# Patient Record
Sex: Female | Born: 1965 | Race: White | Hispanic: No | Marital: Married | Smoking: Never smoker
Health system: Southern US, Community
[De-identification: ages and names within clinical notes are randomized; demographics above are authoritative.]

## PROBLEM LIST (undated history)

## (undated) DIAGNOSIS — O151 Eclampsia in labor: Secondary | ICD-10-CM

## (undated) DIAGNOSIS — I10 Essential (primary) hypertension: Secondary | ICD-10-CM

## (undated) DIAGNOSIS — L309 Dermatitis, unspecified: Secondary | ICD-10-CM

## (undated) DIAGNOSIS — N979 Female infertility, unspecified: Secondary | ICD-10-CM

## (undated) HISTORY — DX: Dermatitis, unspecified: L30.9

## (undated) HISTORY — PX: TYMPANOSTOMY TUBE PLACEMENT: SHX32

## (undated) HISTORY — PX: TONSILLECTOMY: SUR1361

---

## 2009-12-25 ENCOUNTER — Ambulatory Visit: Payer: Self-pay | Admitting: Diagnostic Radiology

## 2009-12-25 ENCOUNTER — Emergency Department (HOSPITAL_BASED_OUTPATIENT_CLINIC_OR_DEPARTMENT_OTHER): Admission: EM | Admit: 2009-12-25 | Discharge: 2009-12-25 | Payer: Self-pay | Admitting: Emergency Medicine

## 2010-10-13 ENCOUNTER — Emergency Department (HOSPITAL_BASED_OUTPATIENT_CLINIC_OR_DEPARTMENT_OTHER): Admission: EM | Admit: 2010-10-13 | Discharge: 2010-10-13 | Payer: Self-pay | Admitting: Emergency Medicine

## 2011-03-06 LAB — URINALYSIS, ROUTINE W REFLEX MICROSCOPIC
Bilirubin Urine: NEGATIVE
Glucose, UA: NEGATIVE mg/dL
Ketones, ur: NEGATIVE mg/dL
Specific Gravity, Urine: 1.01 (ref 1.005–1.030)
pH: 7.5 (ref 5.0–8.0)

## 2011-03-06 LAB — PREGNANCY, URINE: Preg Test, Ur: NEGATIVE

## 2011-06-21 ENCOUNTER — Other Ambulatory Visit: Payer: Self-pay | Admitting: Family Medicine

## 2011-06-21 DIAGNOSIS — Z1231 Encounter for screening mammogram for malignant neoplasm of breast: Secondary | ICD-10-CM

## 2011-07-05 ENCOUNTER — Ambulatory Visit
Admission: RE | Admit: 2011-07-05 | Discharge: 2011-07-05 | Disposition: A | Discharge status: Home / Self Care | Payer: 59 | Source: Ambulatory Visit | Attending: Family Medicine | Admitting: Family Medicine

## 2011-07-05 DIAGNOSIS — Z1231 Encounter for screening mammogram for malignant neoplasm of breast: Secondary | ICD-10-CM

## 2012-08-06 LAB — OB RESULTS CONSOLE ANTIBODY SCREEN: Antibody Screen: NEGATIVE

## 2012-08-06 LAB — OB RESULTS CONSOLE ABO/RH: RH Type: POSITIVE

## 2012-08-06 LAB — OB RESULTS CONSOLE HIV ANTIBODY (ROUTINE TESTING): HIV: NONREACTIVE

## 2012-08-06 LAB — OB RESULTS CONSOLE RPR: RPR: NONREACTIVE

## 2013-01-29 ENCOUNTER — Encounter (HOSPITAL_COMMUNITY): Payer: Self-pay

## 2013-01-29 ENCOUNTER — Inpatient Hospital Stay (HOSPITAL_COMMUNITY): Payer: 59 | Admitting: Anesthesiology

## 2013-01-29 ENCOUNTER — Other Ambulatory Visit: Payer: Self-pay | Admitting: Obstetrics and Gynecology

## 2013-01-29 ENCOUNTER — Encounter (HOSPITAL_COMMUNITY): Payer: Self-pay | Admitting: Anesthesiology

## 2013-01-29 ENCOUNTER — Encounter (HOSPITAL_COMMUNITY)
Admission: AD | Disposition: A | Discharge status: Home / Self Care | Payer: Self-pay | Source: Ambulatory Visit | Attending: Internal Medicine

## 2013-01-29 ENCOUNTER — Other Ambulatory Visit: Payer: Self-pay

## 2013-01-29 ENCOUNTER — Inpatient Hospital Stay (HOSPITAL_COMMUNITY)
Admission: AD | Admit: 2013-01-29 | Discharge: 2013-02-04 | DRG: 765 | Disposition: A | Discharge status: Home / Self Care | Payer: 59 | Source: Ambulatory Visit | Attending: Internal Medicine | Admitting: Internal Medicine

## 2013-01-29 ENCOUNTER — Encounter (HOSPITAL_COMMUNITY): Payer: Self-pay | Admitting: *Deleted

## 2013-01-29 ENCOUNTER — Inpatient Hospital Stay (HOSPITAL_COMMUNITY): Payer: 59

## 2013-01-29 DIAGNOSIS — I6783 Posterior reversible encephalopathy syndrome: Secondary | ICD-10-CM | POA: Diagnosis not present

## 2013-01-29 DIAGNOSIS — O152 Eclampsia in the puerperium: Secondary | ICD-10-CM | POA: Diagnosis not present

## 2013-01-29 DIAGNOSIS — Z79899 Other long term (current) drug therapy: Secondary | ICD-10-CM

## 2013-01-29 DIAGNOSIS — O30009 Twin pregnancy, unspecified number of placenta and unspecified number of amniotic sacs, unspecified trimester: Secondary | ICD-10-CM | POA: Diagnosis present

## 2013-01-29 DIAGNOSIS — I10 Essential (primary) hypertension: Secondary | ICD-10-CM

## 2013-01-29 DIAGNOSIS — G9349 Other encephalopathy: Secondary | ICD-10-CM | POA: Diagnosis not present

## 2013-01-29 DIAGNOSIS — R680 Hypothermia, not associated with low environmental temperature: Secondary | ICD-10-CM | POA: Diagnosis not present

## 2013-01-29 DIAGNOSIS — IMO0002 Reserved for concepts with insufficient information to code with codable children: Secondary | ICD-10-CM | POA: Diagnosis present

## 2013-01-29 DIAGNOSIS — I313 Pericardial effusion (noninflammatory): Secondary | ICD-10-CM | POA: Diagnosis not present

## 2013-01-29 DIAGNOSIS — J310 Chronic rhinitis: Secondary | ICD-10-CM | POA: Diagnosis not present

## 2013-01-29 DIAGNOSIS — R569 Unspecified convulsions: Secondary | ICD-10-CM

## 2013-01-29 DIAGNOSIS — O3090 Multiple gestation, unspecified, unspecified trimester: Secondary | ICD-10-CM | POA: Diagnosis present

## 2013-01-29 DIAGNOSIS — O26899 Other specified pregnancy related conditions, unspecified trimester: Secondary | ICD-10-CM | POA: Diagnosis not present

## 2013-01-29 DIAGNOSIS — J95821 Acute postprocedural respiratory failure: Secondary | ICD-10-CM | POA: Diagnosis not present

## 2013-01-29 DIAGNOSIS — O151 Eclampsia in labor: Secondary | ICD-10-CM

## 2013-01-29 DIAGNOSIS — I469 Cardiac arrest, cause unspecified: Secondary | ICD-10-CM | POA: Diagnosis not present

## 2013-01-29 DIAGNOSIS — O365990 Maternal care for other known or suspected poor fetal growth, unspecified trimester, not applicable or unspecified: Principal | ICD-10-CM | POA: Diagnosis present

## 2013-01-29 DIAGNOSIS — O329XX0 Maternal care for malpresentation of fetus, unspecified, not applicable or unspecified: Secondary | ICD-10-CM | POA: Diagnosis present

## 2013-01-29 DIAGNOSIS — I3139 Other pericardial effusion (noninflammatory): Secondary | ICD-10-CM | POA: Diagnosis not present

## 2013-01-29 DIAGNOSIS — E869 Volume depletion, unspecified: Secondary | ICD-10-CM | POA: Diagnosis not present

## 2013-01-29 DIAGNOSIS — G4700 Insomnia, unspecified: Secondary | ICD-10-CM | POA: Diagnosis not present

## 2013-01-29 DIAGNOSIS — O30049 Twin pregnancy, dichorionic/diamniotic, unspecified trimester: Secondary | ICD-10-CM

## 2013-01-29 DIAGNOSIS — D649 Anemia, unspecified: Secondary | ICD-10-CM

## 2013-01-29 HISTORY — DX: Female infertility, unspecified: N97.9

## 2013-01-29 HISTORY — DX: Essential (primary) hypertension: I10

## 2013-01-29 HISTORY — DX: Eclampsia complicating labor: O15.1

## 2013-01-29 LAB — URINALYSIS, ROUTINE W REFLEX MICROSCOPIC
Leukocytes, UA: NEGATIVE
Nitrite: NEGATIVE
Protein, ur: 100 mg/dL — AB
Urobilinogen, UA: 0.2 mg/dL (ref 0.0–1.0)

## 2013-01-29 LAB — URINE MICROSCOPIC-ADD ON

## 2013-01-29 LAB — COMPREHENSIVE METABOLIC PANEL
ALT: 23 U/L (ref 0–35)
ALT: 78 U/L — ABNORMAL HIGH (ref 0–35)
AST: 35 U/L (ref 0–37)
AST: 125 U/L — ABNORMAL HIGH (ref 0–37)
Albumin: 2.5 g/dL — ABNORMAL LOW (ref 3.5–5.2)
Alkaline Phosphatase: 223 U/L — ABNORMAL HIGH (ref 39–117)
CO2: 18 mEq/L — ABNORMAL LOW (ref 19–32)
Calcium: 9.7 mg/dL (ref 8.4–10.5)
Calcium: 8.8 mg/dL (ref 8.4–10.5)
GFR calc Af Amer: 77 mL/min — ABNORMAL LOW (ref >90)
GFR calc non Af Amer: 62 mL/min — ABNORMAL LOW (ref >90)
Potassium: 4.4 mEq/L (ref 3.5–5.1)
Sodium: 138 mEq/L (ref 135–145)
Sodium: 138 mEq/L (ref 135–145)
Total Protein: 6.1 g/dL (ref 6.0–8.3)
Total Protein: 5.3 g/dL — ABNORMAL LOW (ref 6.0–8.3)

## 2013-01-29 LAB — BLOOD GAS, ARTERIAL
Acid-base deficit: 5.8 mmol/L — ABNORMAL HIGH (ref 0.0–2.0)
Drawn by: 29925
O2 Saturation: 97 %
TCO2: 19.7 mmol/L (ref 0–100)

## 2013-01-29 LAB — TROPONIN I: Troponin I: <0.3 ng/mL (ref <0.30)

## 2013-01-29 LAB — CBC
HCT: 36.7 % (ref 36.0–46.0)
MCH: 26.6 pg (ref 26.0–34.0)
MCV: 81 fL (ref 78.0–100.0)
Platelets: 216 10*3/uL (ref 150–400)
Platelets: 201 10*3/uL (ref 150–400)
RBC: 4.53 MIL/uL (ref 3.87–5.11)
RBC: 4.44 MIL/uL (ref 3.87–5.11)
WBC: 9 10*3/uL (ref 4.0–10.5)

## 2013-01-29 LAB — URIC ACID: Uric Acid, Serum: 6.2 mg/dL (ref 2.4–7.0)

## 2013-01-29 LAB — GLUCOSE, CAPILLARY: Glucose-Capillary: 70 mg/dL (ref 70–99)

## 2013-01-29 SURGERY — Surgical Case
Anesthesia: Spinal | Site: Abdomen | Wound class: Clean Contaminated

## 2013-01-29 MED ORDER — BUPIVACAINE HCL (PF) 0.25 % IJ SOLN
INTRAMUSCULAR | Status: DC | PRN
  Administered 2013-01-29: 10 mL

## 2013-01-29 MED ORDER — PRENATAL MULTIVITAMIN CH: 1.0000 | ORAL_TABLET | Freq: Every day | ORAL | Status: DC

## 2013-01-29 MED ORDER — LABETALOL HCL 5 MG/ML IV SOLN
INTRAVENOUS | Status: AC
  Filled 2013-01-29: qty 8

## 2013-01-29 MED ORDER — LABETALOL HCL 200 MG PO TABS: 200.0000 mg | ORAL_TABLET | Freq: Once | ORAL | Status: DC

## 2013-01-29 MED ORDER — LABETALOL HCL 5 MG/ML IV SOLN
20.0000 mg | Freq: Once | INTRAVENOUS | Status: AC
  Administered 2013-01-29: 20 mg via INTRAVENOUS

## 2013-01-29 MED ORDER — NALOXONE HCL 1 MG/ML IJ SOLN: 1.0000 ug/kg/h | INTRAVENOUS | Status: DC | PRN

## 2013-01-29 MED ORDER — NALBUPHINE HCL 10 MG/ML IJ SOLN: 5.0000 mg | INTRAMUSCULAR | Status: DC | PRN

## 2013-01-29 MED ORDER — SODIUM CHLORIDE 0.9 % IJ SOLN: 3.0000 mL | INTRAMUSCULAR | Status: DC | PRN

## 2013-01-29 MED ORDER — BUPIVACAINE IN DEXTROSE 0.75-8.25 % IT SOLN
INTRATHECAL | Status: DC | PRN
  Administered 2013-01-29: 1.5 mL via INTRATHECAL

## 2013-01-29 MED ORDER — KETOROLAC TROMETHAMINE 30 MG/ML IJ SOLN: 30.0000 mg | Freq: Four times a day (QID) | INTRAMUSCULAR | Status: DC | PRN

## 2013-01-29 MED ORDER — CITRIC ACID-SODIUM CITRATE 334-500 MG/5ML PO SOLN: 30.0000 mL | Freq: Once | ORAL | Status: DC

## 2013-01-29 MED ORDER — DIPHENHYDRAMINE HCL 50 MG/ML IJ SOLN: 25.0000 mg | INTRAMUSCULAR | Status: DC | PRN

## 2013-01-29 MED ORDER — LORAZEPAM 2 MG/ML IJ SOLN
INTRAMUSCULAR | Status: AC
  Filled 2013-01-29: qty 1

## 2013-01-29 MED ORDER — KETOROLAC TROMETHAMINE 60 MG/2ML IM SOLN
INTRAMUSCULAR | Status: AC
  Administered 2013-01-29: 60 mg via INTRAMUSCULAR
  Filled 2013-01-29: qty 2

## 2013-01-29 MED ORDER — LACTATED RINGERS IV SOLN
INTRAVENOUS | Status: DC
  Administered 2013-01-29 – 2013-01-31 (×5): via INTRAVENOUS

## 2013-01-29 MED ORDER — OXYTOCIN 10 UNIT/ML IJ SOLN
40.0000 [IU] | INTRAVENOUS | Status: DC | PRN
  Administered 2013-01-29: 40 [IU] via INTRAVENOUS

## 2013-01-29 MED ORDER — METOCLOPRAMIDE HCL 5 MG/ML IJ SOLN
10.0000 mg | Freq: Three times a day (TID) | INTRAMUSCULAR | Status: DC | PRN
  Filled 2013-01-29: qty 2

## 2013-01-29 MED ORDER — LABETALOL HCL 5 MG/ML IV SOLN
40.0000 mg | Freq: Once | INTRAVENOUS | Status: AC
  Administered 2013-01-29: 40 mg via INTRAVENOUS

## 2013-01-29 MED ORDER — PHENYLEPHRINE HCL 10 MG/ML IJ SOLN
INTRAMUSCULAR | Status: DC | PRN
  Administered 2013-01-29 (×4): 40 ug via INTRAVENOUS

## 2013-01-29 MED ORDER — DIBUCAINE 1 % RE OINT: 1.0000 "application " | TOPICAL_OINTMENT | RECTAL | Status: DC | PRN

## 2013-01-29 MED ORDER — SIMETHICONE 80 MG PO CHEW
80.0000 mg | CHEWABLE_TABLET | ORAL | Status: DC | PRN
  Filled 2013-01-29: qty 1

## 2013-01-29 MED ORDER — IBUPROFEN 600 MG PO TABS: 600.0000 mg | ORAL_TABLET | Freq: Four times a day (QID) | ORAL | Status: DC | PRN

## 2013-01-29 MED ORDER — HYDRALAZINE HCL 20 MG/ML IJ SOLN
INTRAMUSCULAR | Status: AC
  Filled 2013-01-29: qty 1

## 2013-01-29 MED ORDER — FAMOTIDINE IN NACL 20-0.9 MG/50ML-% IV SOLN: 20.0000 mg | Freq: Once | INTRAVENOUS | Status: DC

## 2013-01-29 MED ORDER — KETOROLAC TROMETHAMINE 60 MG/2ML IM SOLN
60.0000 mg | Freq: Once | INTRAMUSCULAR | Status: AC | PRN
  Administered 2013-01-29: 60 mg via INTRAMUSCULAR

## 2013-01-29 MED ORDER — ONDANSETRON HCL 4 MG/2ML IJ SOLN: 4.0000 mg | Freq: Three times a day (TID) | INTRAMUSCULAR | Status: DC | PRN

## 2013-01-29 MED ORDER — EPHEDRINE SULFATE 50 MG/ML IJ SOLN
INTRAMUSCULAR | Status: DC | PRN
  Administered 2013-01-29: 10 mg via INTRAVENOUS

## 2013-01-29 MED ORDER — IBUPROFEN 600 MG PO TABS
600.0000 mg | ORAL_TABLET | Freq: Four times a day (QID) | ORAL | Status: DC
  Administered 2013-01-30: 600 mg via ORAL
  Filled 2013-01-29 (×4): qty 1

## 2013-01-29 MED ORDER — MORPHINE SULFATE (PF) 0.5 MG/ML IJ SOLN
INTRAMUSCULAR | Status: DC | PRN
  Administered 2013-01-29: .1 mg via INTRATHECAL

## 2013-01-29 MED ORDER — CEFAZOLIN SODIUM-DEXTROSE 2-3 GM-% IV SOLR
2.0000 g | INTRAVENOUS | Status: AC
  Administered 2013-01-29: 2 g via INTRAVENOUS
  Filled 2013-01-29: qty 50

## 2013-01-29 MED ORDER — LACTATED RINGERS IV BOLUS (SEPSIS)
1000.0000 mL | Freq: Once | INTRAVENOUS | Status: AC
  Administered 2013-01-29: 1000 mL via INTRAVENOUS

## 2013-01-29 MED ORDER — TETANUS-DIPHTH-ACELL PERTUSSIS 5-2.5-18.5 LF-MCG/0.5 IM SUSP
0.5000 mL | Freq: Once | INTRAMUSCULAR | Status: AC
  Administered 2013-01-30: 0.5 mL via INTRAMUSCULAR
  Filled 2013-01-29: qty 0.5

## 2013-01-29 MED ORDER — LANOLIN HYDROUS EX OINT: 1.0000 "application " | TOPICAL_OINTMENT | CUTANEOUS | Status: DC | PRN

## 2013-01-29 MED ORDER — WITCH HAZEL-GLYCERIN EX PADS: 1.0000 "application " | MEDICATED_PAD | CUTANEOUS | Status: DC | PRN

## 2013-01-29 MED ORDER — MEPERIDINE HCL 25 MG/ML IJ SOLN
INTRAMUSCULAR | Status: AC
  Administered 2013-01-29: 6.25 mg via INTRAVENOUS
  Filled 2013-01-29: qty 1

## 2013-01-29 MED ORDER — MAGNESIUM SULFATE 40 G IN LACTATED RINGERS - SIMPLE
1.0000 g/h | INTRAVENOUS | Status: DC
  Administered 2013-01-29 – 2013-01-31 (×2): 2 g/h via INTRAVENOUS
  Filled 2013-01-29 (×2): qty 500

## 2013-01-29 MED ORDER — EPHEDRINE 5 MG/ML INJ
INTRAVENOUS | Status: AC
  Filled 2013-01-29: qty 10

## 2013-01-29 MED ORDER — HYDRALAZINE HCL 20 MG/ML IJ SOLN
5.0000 mg | Freq: Once | INTRAMUSCULAR | Status: AC
  Administered 2013-01-29: 5 mg via INTRAVENOUS

## 2013-01-29 MED ORDER — SCOPOLAMINE 1 MG/3DAYS TD PT72
1.0000 | MEDICATED_PATCH | Freq: Once | TRANSDERMAL | Status: DC
  Administered 2013-01-29: 1.5 mg via TRANSDERMAL

## 2013-01-29 MED ORDER — MEPERIDINE HCL 25 MG/ML IJ SOLN
6.2500 mg | INTRAMUSCULAR | Status: DC | PRN
  Administered 2013-01-29: 6.25 mg via INTRAVENOUS

## 2013-01-29 MED ORDER — ONDANSETRON HCL 4 MG/2ML IJ SOLN
INTRAMUSCULAR | Status: DC | PRN
  Administered 2013-01-29: 4 mg via INTRAVENOUS

## 2013-01-29 MED ORDER — ONDANSETRON HCL 4 MG PO TABS: 4.0000 mg | ORAL_TABLET | ORAL | Status: DC | PRN

## 2013-01-29 MED ORDER — SIMETHICONE 80 MG PO CHEW
80.0000 mg | CHEWABLE_TABLET | Freq: Three times a day (TID) | ORAL | Status: DC
  Filled 2013-01-29 (×3): qty 1

## 2013-01-29 MED ORDER — FENTANYL CITRATE 0.05 MG/ML IJ SOLN
INTRAMUSCULAR | Status: AC
  Filled 2013-01-29: qty 2

## 2013-01-29 MED ORDER — CITRIC ACID-SODIUM CITRATE 334-500 MG/5ML PO SOLN
30.0000 mL | Freq: Once | ORAL | Status: AC
  Administered 2013-01-29: 30 mL via ORAL
  Filled 2013-01-29: qty 15

## 2013-01-29 MED ORDER — OXYTOCIN 40 UNITS IN LACTATED RINGERS INFUSION - SIMPLE MED
62.5000 mL/h | INTRAVENOUS | Status: AC
  Filled 2013-01-29: qty 1000

## 2013-01-29 MED ORDER — SALINE SPRAY 0.65 % NA SOLN
1.0000 | NASAL | Status: DC | PRN
  Administered 2013-01-29 – 2013-01-31 (×2): 1 via NASAL
  Filled 2013-01-29 (×2): qty 44

## 2013-01-29 MED ORDER — FAMOTIDINE IN NACL 20-0.9 MG/50ML-% IV SOLN
20.0000 mg | Freq: Once | INTRAVENOUS | Status: AC
  Administered 2013-01-29: 20 mg via INTRAVENOUS
  Filled 2013-01-29: qty 50

## 2013-01-29 MED ORDER — MAGNESIUM SULFATE BOLUS VIA INFUSION
4.0000 g | Freq: Once | INTRAVENOUS | Status: DC
  Filled 2013-01-29: qty 500

## 2013-01-29 MED ORDER — LACTATED RINGERS IV SOLN
INTRAVENOUS | Status: DC | PRN
  Administered 2013-01-29 (×3): via INTRAVENOUS

## 2013-01-29 MED ORDER — PHENYLEPHRINE 40 MCG/ML (10ML) SYRINGE FOR IV PUSH (FOR BLOOD PRESSURE SUPPORT)
PREFILLED_SYRINGE | INTRAVENOUS | Status: AC
  Filled 2013-01-29: qty 5

## 2013-01-29 MED ORDER — FENTANYL CITRATE 0.05 MG/ML IJ SOLN: 25.0000 ug | INTRAMUSCULAR | Status: DC | PRN

## 2013-01-29 MED ORDER — LORAZEPAM 2 MG/ML IJ SOLN
1.0000 mg | Freq: Once | INTRAMUSCULAR | Status: AC
  Administered 2013-01-29: 1 mg via INTRAVENOUS

## 2013-01-29 MED ORDER — ONDANSETRON HCL 4 MG/2ML IJ SOLN
4.0000 mg | INTRAMUSCULAR | Status: DC | PRN
  Administered 2013-01-31 – 2013-02-01 (×8): 4 mg via INTRAVENOUS
  Filled 2013-01-29 (×8): qty 2

## 2013-01-29 MED ORDER — SCOPOLAMINE 1 MG/3DAYS TD PT72
MEDICATED_PATCH | TRANSDERMAL | Status: AC
  Administered 2013-01-29: 1.5 mg via TRANSDERMAL
  Filled 2013-01-29: qty 1

## 2013-01-29 MED ORDER — DIPHENHYDRAMINE HCL 50 MG/ML IJ SOLN
12.5000 mg | INTRAMUSCULAR | Status: DC | PRN
  Administered 2013-01-30: 12.5 mg via INTRAVENOUS
  Filled 2013-01-29: qty 1

## 2013-01-29 MED ORDER — MORPHINE SULFATE 0.5 MG/ML IJ SOLN
INTRAMUSCULAR | Status: AC
  Filled 2013-01-29: qty 10

## 2013-01-29 MED ORDER — FENTANYL CITRATE 0.05 MG/ML IJ SOLN
INTRAMUSCULAR | Status: DC | PRN
  Administered 2013-01-29: 15 ug via INTRATHECAL

## 2013-01-29 MED ORDER — SENNOSIDES-DOCUSATE SODIUM 8.6-50 MG PO TABS
2.0000 | ORAL_TABLET | Freq: Every day | ORAL | Status: DC
  Filled 2013-01-29: qty 2

## 2013-01-29 MED ORDER — MENTHOL 3 MG MT LOZG: 1.0000 | LOZENGE | OROMUCOSAL | Status: DC | PRN

## 2013-01-29 MED ORDER — ZOLPIDEM TARTRATE 5 MG PO TABS: 5.0000 mg | ORAL_TABLET | Freq: Every evening | ORAL | Status: DC | PRN

## 2013-01-29 MED ORDER — LABETALOL HCL 200 MG PO TABS
200.0000 mg | ORAL_TABLET | Freq: Once | ORAL | Status: AC
  Administered 2013-01-29: 200 mg via ORAL
  Filled 2013-01-29: qty 1

## 2013-01-29 MED ORDER — DIPHENHYDRAMINE HCL 25 MG PO CAPS
25.0000 mg | ORAL_CAPSULE | Freq: Four times a day (QID) | ORAL | Status: DC | PRN
  Filled 2013-01-29: qty 1

## 2013-01-29 MED ORDER — DIPHENHYDRAMINE HCL 25 MG PO CAPS
25.0000 mg | ORAL_CAPSULE | ORAL | Status: DC | PRN
  Filled 2013-01-29: qty 1

## 2013-01-29 MED ORDER — OXYMETAZOLINE HCL 0.05 % NA SOLN
1.0000 | Freq: Two times a day (BID) | NASAL | Status: DC
  Administered 2013-01-31 – 2013-02-02 (×5): 1 via NASAL
  Filled 2013-01-29 (×3): qty 15

## 2013-01-29 MED ORDER — OXYCODONE-ACETAMINOPHEN 5-325 MG PO TABS: 1.0000 | ORAL_TABLET | ORAL | Status: DC | PRN

## 2013-01-29 MED ORDER — NALOXONE HCL 0.4 MG/ML IJ SOLN: 0.4000 mg | INTRAMUSCULAR | Status: DC | PRN

## 2013-01-29 SURGICAL SUPPLY — 31 items
CLOTH BEACON ORANGE TIMEOUT ST (SAFETY) ×2 IMPLANT
CONTAINER PREFILL 10% NBF 15ML (MISCELLANEOUS) IMPLANT
DRAPE LG THREE QUARTER DISP (DRAPES) ×2 IMPLANT
DRESSING TELFA 8X3 (GAUZE/BANDAGES/DRESSINGS) IMPLANT
DRSG OPSITE POSTOP 4X10 (GAUZE/BANDAGES/DRESSINGS) ×2 IMPLANT
DURAPREP 26ML APPLICATOR (WOUND CARE) ×2 IMPLANT
ELECT REM PT RETURN 9FT ADLT (ELECTROSURGICAL) ×2
ELECTRODE REM PT RTRN 9FT ADLT (ELECTROSURGICAL) ×1 IMPLANT
EXTRACTOR VACUUM M CUP 4 TUBE (SUCTIONS) IMPLANT
GAUZE SPONGE 4X4 12PLY STRL LF (GAUZE/BANDAGES/DRESSINGS) ×4 IMPLANT
GLOVE BIO SURGEON STRL SZ7.5 (GLOVE) ×4 IMPLANT
GOWN PREVENTION PLUS LG XLONG (DISPOSABLE) ×4 IMPLANT
GOWN PREVENTION PLUS XLARGE (GOWN DISPOSABLE) ×2 IMPLANT
KIT ABG SYR 3ML LUER SLIP (SYRINGE) IMPLANT
NEEDLE HYPO 25X1 1.5 SAFETY (NEEDLE) ×2 IMPLANT
NEEDLE HYPO 25X5/8 SAFETYGLIDE (NEEDLE) IMPLANT
NS IRRIG 1000ML POUR BTL (IV SOLUTION) ×2 IMPLANT
PACK C SECTION WH (CUSTOM PROCEDURE TRAY) ×2 IMPLANT
PAD ABD 7.5X8 STRL (GAUZE/BANDAGES/DRESSINGS) IMPLANT
SLEEVE SCD COMPRESS KNEE MED (MISCELLANEOUS) IMPLANT
STAPLER VISISTAT 35W (STAPLE) ×2 IMPLANT
SUT MNCRL 0 VIOLET CTX 36 (SUTURE) ×2 IMPLANT
SUT MON AB 2-0 CT1 27 (SUTURE) ×2 IMPLANT
SUT MON AB-0 CT1 36 (SUTURE) ×4 IMPLANT
SUT MONOCRYL 0 CTX 36 (SUTURE) ×2
SUT PLAIN 0 NONE (SUTURE) IMPLANT
SUT PLAIN 2 0 XLH (SUTURE) IMPLANT
SYR CONTROL 10ML LL (SYRINGE) ×2 IMPLANT
TOWEL OR 17X24 6PK STRL BLUE (TOWEL DISPOSABLE) ×6 IMPLANT
TRAY FOLEY CATH 14FR (SET/KITS/TRAYS/PACK) ×2 IMPLANT
WATER STERILE IRR 1000ML POUR (IV SOLUTION) ×2 IMPLANT

## 2013-01-29 NOTE — Anesthesia Postprocedure Evaluation (Signed)
  Anesthesia Post-op Note  Anesthesia Post Note  Patient: Hannah Vega  Procedure(s) Performed: Procedure(s) (LRB): CESAREAN SECTION  twins 35 weeks (N/A)  Anesthesia type: Spinal  Patient location: PACU  Post pain: Pain level controlled  Post assessment: Post-op Vital signs reviewed  Last Vitals:  Filed Vitals:   01/29/13 2030  BP: 119/89  Pulse: 63  Temp:   Resp: 20    Post vital signs: Reviewed  Level of consciousness: awake  Complications: No apparent anesthesia complications

## 2013-01-29 NOTE — Consult Note (Signed)
At 2157 I received a code blue notification for room 312. After a 2 minute delay for an elevator I arrived in the room with the MAU NP to find a post C-section patient having her airway attended to by two RT's with a mask airway. The patient was breathing ~ 10 times/min with minimal blood in her airway. Her oxygen saturation was 100%. The patient was clearly postictal however by the time I left her in the care of Dr. Seymour Bars she was responding to commands and with minimal prompting was aware she had delivered today. She was off by one on the date. Prior to Dr. Seymour Bars arriving I ordered that a 4gm magnesium bolus be started as she had already had seizure activity associated with high blood pressure. I was in the room from 2159-2224, except for a brief exit to notify the patient's family of her status and to offer comfort and reassurance.  Caren Macadam. M.D.

## 2013-01-29 NOTE — Transfer of Care (Signed)
Immediate Anesthesia Transfer of Care Note  Patient: Hannah Vega  Procedure(s) Performed: Procedure(s) with comments: CESAREAN SECTION  twins 35 weeks (N/A) - Primary C/S   EDD: 03/05/13  Patient Location: PACU  Anesthesia Type:Spinal  Level of Consciousness: awake, alert  and oriented  Airway & Oxygen Therapy: Patient Spontanous Breathing  Post-op Assessment: Report given to PACU RN and Post -op Vital signs reviewed and stable  Post vital signs: Reviewed and stable  Complications: No apparent anesthesia complications

## 2013-01-29 NOTE — Op Note (Signed)
Cesarean Section Procedure Note  Indications: HTN and severe IUGR twin A  Pre-operative Diagnosis: 35 week 0 day pregnancy.  Post-operative Diagnosis: same  Surgeon: Lenoard Aden   Assistants: mody  Anesthesia: Local anesthesia 0.25.% bupivacaine and Spinal anesthesia  ASA Class: 2  Procedure Details  The patient was seen in the Holding Room. The risks, benefits, complications, treatment options, and expected outcomes were discussed with the patient.  The patient concurred with the proposed plan, giving informed consent. The risks of anesthesia, infection, bleeding and possible injury to other organs discussed. Injury to bowel, bladder, or ureter with possible need for repair discussed. Possible need for transfusion with secondary risks of hepatitis or HIV acquisition discussed. Post operative complications to include but not limited to DVT, PE and Pneumonia noted. The site of surgery properly noted/marked. The patient was taken to Operating Room # 9, identified as Hannah Vega and the procedure verified as C-Section Delivery. A Time Out was held and the above information confirmed.  After induction of anesthesia, the patient was draped and prepped in the usual sterile manner. A Pfannenstiel incision was made and carried down through the subcutaneous tissue to the fascia. Fascial incision was made and extended transversely using Mayo scissors. The fascia was separated from the underlying rectus tissue superiorly and inferiorly. The peritoneum was identified and entered. Peritoneal incision was extended longitudinally. The utero-vesical peritoneal reflection was incised transversely and the bladder flap was bluntly freed from the lower uterine segment. A low transverse uterine incision(Kerr hysterotomy) was made. Delivered from transverse presentation was a  female with Apgar scores of 8 at one minute and 8 at five minutes and Breech delivery of twin B was a female with Apgars pending. Bulb  suctioning gently performed. Neonatal team in attendance.After the umbilical cord was clamped and cut cord blood was obtained for evaluation. The placenta was removed intact and appeared normal. The uterus was curetted with a dry lap pack. Good hemostasis was noted.The uterine outline, tubes and ovaries appeared normal. The uterine incision was closed with running locked sutures of 0 Monocryl x 2 layers. Hemostasis was observed. Lavage was carried out until clear.The parietal peritoneum was closed with a running 2-0 Monocryl suture. The fascia was then reapproximated with running sutures of 0 Monocryl. The skin was reapproximated with staples.  Instrument, sponge, and needle counts were correct prior the abdominal closure and at the conclusion of the case.   Findings: above  Estimated Blood Loss:  500         Drains: foley                 Specimens: placenta                 Complications:  None; patient tolerated the procedure well.         Disposition: PACU - hemodynamically stable.         Condition: stable  Attending Attestation: I performed the procedure.     Cesarean Section Procedure Note  Indications: twins  Pre-operative Diagnosis: 35 week 0 day pregnancy.  Post-operative Diagnosis: same  Surgeon: Lenoard Aden   Assistants: mody  Anesthesia: Local anesthesia 0.25.% bupivacaine and Spinal anesthesia  ASA Class: 2  Procedure Details  The patient was seen in the Holding Room. The risks, benefits, complications, treatment options, and expected outcomes were discussed with the patient.  The patient concurred with the proposed plan, giving informed consent. The risks of anesthesia, infection, bleeding and possible injury to other organs  discussed. Injury to bowel, bladder, or ureter with possible need for repair discussed. Possible need for transfusion with secondary risks of hepatitis or HIV acquisition discussed. Post operative complications to include but not  limited to DVT, PE and Pneumonia noted. The site of surgery properly noted/marked. The patient was taken to Operating Room # 9, identified as Hannah Vega and the procedure verified as C-Section Delivery. A Time Out was held and the above information confirmed.  After induction of anesthesia, the patient was draped and prepped in the usual sterile manner. A Pfannenstiel incision was made and carried down through the subcutaneous tissue to the fascia. Fascial incision was made and extended transversely using Mayo scissors. The fascia was separated from the underlying rectus tissue superiorly and inferiorly. The peritoneum was identified and entered. Peritoneal incision was extended longitudinally. The utero-vesical peritoneal reflection was incised transversely and the bladder flap was bluntly freed from the lower uterine segment. A low transverse uterine incision(Kerr hysterotomy) was made. Delivered from trans presentation was a  female with Apgar scores of 9 at one minute and 9 at five minutes. Bulb suctioning gently performed. Neonatal team in attendance.After the umbilical cord was clamped and cut cord blood was obtained for evaluation. The placenta was removed intact and appeared normal. The uterus was curetted with a dry lap pack. Good hemostasis was noted.The uterine outline, tubes and ovaries appeared normal. The uterine incision was closed with running locked sutures of 0 Monocryl x 2 layers. Hemostasis was observed. Lavage was carried out until clear.The parietal peritoneum was closed with a running 2-0 Monocryl suture. The fascia was then reapproximated with running sutures of 0 Monocryl. The skin was reapproximated with staples.  Instrument, sponge, and needle counts were correct prior the abdominal closure and at the conclusion of the case.   Findings: above  Estimated Blood Loss:  500         Drains: foley                 Specimens: above                 Complications:  None; patient  tolerated the procedure well.         Disposition: PACU - hemodynamically stable.         Condition: stable  Attending Attestation: I performed the procedure.     Cesarean Section Procedure Note  Indications: twins  Pre-operative Diagnosis: 35 week 0 day pregnancy.  Post-operative Diagnosis: same  Surgeon: Lenoard Aden   Assistants: mody   Anesthesia: Local anesthesia 0.25.% bupivacaine and Spinal anesthesia  ASA Class: 2  Procedure Details  The patient was seen in the Holding Room. The risks, benefits, complications, treatment options, and expected outcomes were discussed with the patient.  The patient concurred with the proposed plan, giving informed consent. The risks of anesthesia, infection, bleeding and possible injury to other organs discussed. Injury to bowel, bladder, or ureter with possible need for repair discussed. Possible need for transfusion with secondary risks of hepatitis or HIV acquisition discussed. Post operative complications to include but not limited to DVT, PE and Pneumonia noted. The site of surgery properly noted/marked. The patient was taken to Operating Room # a, identified as NAKEYSHA PASQUAL and the procedure verified as C-Section Delivery. A Time Out was held and the above information confirmed.  After induction of anesthesia, the patient was draped and prepped in the usual sterile manner. A Pfannenstiel incision was made and carried down through the subcutaneous  tissue to the fascia. Fascial incision was made and extended transversely using Mayo scissors. The fascia was separated from the underlying rectus tissue superiorly and inferiorly. The peritoneum was identified and entered. Peritoneal incision was extended longitudinally. The utero-vesical peritoneal reflection was incised transversely and the bladder flap was bluntly freed from the lower uterine segment. A low transverse uterine incision(Kerr hysterotomy) was made. Delivered from a presentation  was a  a with Apgar scores of 8 at one minute and 8 at five minutes. Bulb suctioning gently performed. Neonatal team in attendance.After the umbilical cord was clamped and cut cord blood was obtained for evaluation. The placenta was removed intact and appeared normal. The uterus was curetted with a dry lap pack. Good hemostasis was noted.The uterine outline, tubes and ovaries appeared normal. The uterine incision was closed with running locked sutures of 0 Monocryl x 2 layers. Hemostasis was observed. Lavage was carried out until clear.The parietal peritoneum was closed with a running 2-0 Monocryl suture. The fascia was then reapproximated with running sutures of 0 Monocryl. The skin was reapproximated with staples.  Instrument, sponge, and needle counts were correct prior the abdominal closure and at the conclusion of the case.   Findings: none  Estimated Blood Loss:  500         Drains: dfoley                 Specimens: above                 Complications:  None; patient tolerated the procedure well.         Disposition: PACU - hemodynamically stable.         Condition: stable  Attending Attestation: I performed the procedure.

## 2013-01-29 NOTE — Progress Notes (Addendum)
Patient arrived to floor from PACU around 2130. Patient was alert and oriented x4. Introduced self to patient and a set of vital signs were taken on arrival. Vital signs were as follows:  T- 97.3, BP 179/93, HR 64. R 16, O2 97% on room air. Assessed patient and saw no significant assessment findings.  Around 2145, I went to retrieve the patient's family members and get the patient something to drink. Once I returned back in the patient's room, oxygent monitor was beeping that O2 sat was at 87%. Asked the patient if she had any oxygen on downstairs in PACU and she reported that she had not. I told the patient I was going to put her own 2L of oxygen just to keep sats up. Immediately went to get a nasal cannula, and once I returned back into the room oxygen was reading 66%. Cannula immediately put on and that's when patient began to extend her arms and her lips began to turn blue and she started spitting frothy sputum. Patient's family member who is a Charity fundraiser, helped myself to turn patient onto her side and help was called. Almyra Deforest, RN was the first to arrive in room. Pulse was checked and patient had no pulse. Debbie instructed someone to go and receive the code cart. Compressions were done between [facility] and patient's family member no more than 2 mins and a Code blue was called around 2150. Once code team arrived [place], CNM instructed someone to call Dr. Billy Coast and notify him of patient's status. At which point, Arnold Long, RN paged Ta[place]on to notify of patient's status. Once patient was stabilized, she was transferred over to [facility] room 372 around 2305  for further monitoring.

## 2013-01-29 NOTE — MAU Note (Signed)
Pt here for primary C/S, twins.  Baby A dx'd with IUGR today @ [facility].

## 2013-01-29 NOTE — Anesthesia Procedure Notes (Signed)
Spinal  Patient location during procedure: OR Start time: 01/29/2013 6:35 PM Staffing Performed by: anesthesiologist  Preanesthetic Checklist Completed: patient identified, site marked, surgical consent, pre-op evaluation, timeout performed, IV checked, risks and benefits discussed and monitors and equipment checked Spinal Block Patient position: sitting Prep: site prepped and draped and DuraPrep Patient monitoring: heart rate, cardiac monitor, continuous pulse ox and blood pressure Approach: midline Location: L3-4 Injection technique: single-shot Needle Needle type: Sprotte  Needle gauge: 24 G Needle length: 9 cm Assessment Sensory level: T4 Additional Notes Clear free flow CSF on first attempt.  No paresthesia.  Patient tolerated procedure well with no apparent complications.  Jasmine December, MD

## 2013-01-29 NOTE — H&P (Signed)
Hannah Vega, Hannah Vega                  ACCOUNT NO.:  [id number]  MEDICAL RECORD NO.:  [id number]  LOCATION:  [facility]                          FACILITY:  [facility]  PHYSICIAN:  Lenoard Aden, M.D.DATE OF BIRTH:  09/22/1966  DATE OF ADMISSION:  08/31/2012 DATE OF DISCHARGE:                             HISTORY & PHYSICAL   CHIEF COMPLAINT:  Severe IUGR of twin A.  She is a 47 year old white female, at 5 weeks with a dichorionic diamniotic twin gestation as a result of donor IVF, who presents now for primary C-section due to breech presentation, severe IUGR of twin A at the third percentile with no growth x3 weeks.  In addition, patient has chronic hypertension with an exacerbation on labetalol.  MEDICATIONS:  Include labetalol and prenatal vitamins.  Labetalol dose is 200 mg, 100 mg and then 200 mg 3 times a day dosing.  ALLERGIES:  She has allergies to codeine, erythromycin, and Phenergan.  SOCIAL HISTORY:  She is a nonsmoker, nondrinker.  She denies domestic or physical violence.  PAST MEDICAL HISTORY:  Her medical problems are remarkable for hypertension and infertility.  She has a family history of neurovascular disease, myocardial infarction, hypertension, and diabetes.  Her prenatal course uncomplicated.  Chronic hypertension, well controlled until recently with exacerbation over the past week in addition ultrasound findings as previously described.  PHYSICAL EXAMINATION:  VITAL SIGNS:  Her blood pressure 150/92, weight of 179 pounds. HEENT:  Normal. NECK:  Supple.  Full range of motion. LUNGS:  Clear. HEART:  Regular rhythm. ABDOMEN:  Soft, gravid, nontender.  No CVA tenderness. EXTREMITIES:  There are no cords. NEUROLOGIC:  Nonfocal. SKIN:  Intact. PELVIC:  Deferred.  IMPRESSION: 1. Thirty-five week twin dichorionic diamniotic gestation. 2. Severe IUGR twin A with no interval growth since January 11, 2013. 3. Chronic hypertension on labetalol with exacerbation.  No  evidence     of preeclampsia.  Normal labs today for breech malpresentation of     twin B.  PLAN:  Proceed with primary low segment transverse cesarean section. Risks of anesthesia, infection, bleeding, injury to abdominal organs, need for repair was discussed, delayed versus immediate complications to include bowel and bladder injury noted.  The patient acknowledges and wishes to proceed.     Lenoard Aden, M.D.     RJT/MEDQ  D:  01/29/2013  T:  01/29/2013  Job:  [id number]

## 2013-01-29 NOTE — Anesthesia Preprocedure Evaluation (Addendum)
Anesthesia Evaluation  Patient identified by MRN, date of birth, ID band Patient awake    Reviewed: Allergy & Precautions, H&P , NPO status , Patient's Chart, lab work & pertinent test results, reviewed documented beta blocker date and time   History of Anesthesia Complications Negative for: history of anesthetic complications  Airway Mallampati: III TM Distance: >3 FB Neck ROM: full    Dental  (+) Teeth Intact   Pulmonary neg pulmonary ROS,  breath sounds clear to auscultation        Cardiovascular hypertension, On Home Beta Blockers Rhythm:regular Rate:Normal     Neuro/Psych negative neurological ROS  negative psych ROS   GI/Hepatic negative GI ROS, Neg liver ROS,   Endo/Other  negative endocrine ROS  Renal/GU negative Renal ROS     Musculoskeletal   Abdominal   Peds  Hematology negative hematology ROS (+)   Anesthesia Other Findings Toast at 9 am  Reproductive/Obstetrics (+) Pregnancy (twins)                           Anesthesia Physical Anesthesia Plan  ASA: III  Anesthesia Plan: Spinal   Post-op Pain Management:    Induction:   Airway Management Planned:   Additional Equipment:   Intra-op Plan:   Post-operative Plan:   Informed Consent: I have reviewed the patients History and Physical, chart, labs and discussed the procedure including the risks, benefits and alternatives for the proposed anesthesia with the patient or authorized representative who has indicated his/her understanding and acceptance.     Plan Discussed with: Surgeon and CRNA  Anesthesia Plan Comments:         Anesthesia Quick Evaluation

## 2013-01-29 NOTE — Progress Notes (Signed)
Patient ID: Hannah Vega, female   DOB: [birth year], 47 y.o.   MRN: [id number]  Code blue called after pt experienced possible seizure activity starting at ~2150. POD #0 C/S twins. Lips blue per RN. Pt unresponsive. No pulse.  Arms straight and rigid, eyes rolled back, bloody mucus coming from mouth. Pt turned to side by RN. Code blue called. Started chest compressions, rescue breathing. See RN note for details.   [name], CNM and Dr. Lilli Light anesthesia arrived at 2202. Pt having spontaneous respirations w/ increased WOB, loud ronchi, O2 Sats 99% in room air. Pt restless, not initially responding to commands. Dr. Billy Coast paged. Magnesium Sulfate, CBC, CMET, uric acid, LDH, UA, protein creatinine ratio ordered.  2224: Dr[facility] Juliene Pina at [facility]. Labetalol, Ativan ordered. EKG reviewed by Dr. Juliene Pina, normal. ABG drawn. Pt now oriented x 4, restless, not alert.   Troponin, EKG, blood gas ordered. Dr. Juliene Pina assumed care of pt.  Patient Vitals for the past 24 hrs:  BP Temp Temp src Pulse Resp SpO2 Height Weight  01/29/13 2129 179/93 mmHg 97.5 F (36.4 C) - 64 16 97 % - -  01/29/13 2030 119/89 mmHg 97.6 F (36.4 C) - 63 20 99 % - -  01/29/13 2015 183/92 mmHg - - 60 18 97 % - -  01/29/13 2000 186/83 mmHg - - 62 20 100 % - -  01/29/13 1945 169/86 mmHg - - 62 18 98 % - -  01/29/13 1934 160/81 mmHg 97.6 F (36.4 C) - 68 18 95 % - -  01/29/13 1751 168/98 mmHg - - 81 - 100 % - -  01/29/13 1750 - - - 79 - - - -  01/29/13 1717 204/86 mmHg - - 62 - - - -  01/29/13 1701 203/86 mmHg 97.8 F (36.6 C) Oral - 22 - 5\' 3"  (1.6 m) 81.194 kg (179 lb)   [name], CNM 01/29/2013 10:45 PM

## 2013-01-29 NOTE — Progress Notes (Signed)
Patient seen and examined. Consent witnessed and signed. No changes noted. Update completed. 

## 2013-01-30 ENCOUNTER — Inpatient Hospital Stay (HOSPITAL_COMMUNITY)
Admission: AD | Admit: 2013-01-30 | Payer: Self-pay | Source: Other Acute Inpatient Hospital | Admitting: Internal Medicine

## 2013-01-30 ENCOUNTER — Inpatient Hospital Stay (HOSPITAL_COMMUNITY)
Admit: 2013-01-30 | Discharge: 2013-01-30 | Disposition: A | Discharge status: Home / Self Care | Payer: 59 | Attending: Neurology | Admitting: Neurology

## 2013-01-30 ENCOUNTER — Encounter (HOSPITAL_COMMUNITY): Payer: Self-pay | Admitting: Obstetrics & Gynecology

## 2013-01-30 ENCOUNTER — Other Ambulatory Visit (HOSPITAL_COMMUNITY): Payer: 59

## 2013-01-30 ENCOUNTER — Ambulatory Visit (HOSPITAL_COMMUNITY)
Admit: 2013-01-30 | Discharge: 2013-01-30 | Disposition: A | Discharge status: Home / Self Care | Payer: 59 | Attending: Neurology | Admitting: Neurology

## 2013-01-30 DIAGNOSIS — Z8674 Personal history of sudden cardiac arrest: Secondary | ICD-10-CM | POA: Insufficient documentation

## 2013-01-30 DIAGNOSIS — O152 Eclampsia in the puerperium: Secondary | ICD-10-CM

## 2013-01-30 DIAGNOSIS — O151 Eclampsia in labor: Secondary | ICD-10-CM

## 2013-01-30 DIAGNOSIS — I6783 Posterior reversible encephalopathy syndrome: Secondary | ICD-10-CM | POA: Diagnosis not present

## 2013-01-30 DIAGNOSIS — D649 Anemia, unspecified: Secondary | ICD-10-CM

## 2013-01-30 DIAGNOSIS — R569 Unspecified convulsions: Secondary | ICD-10-CM | POA: Diagnosis not present

## 2013-01-30 DIAGNOSIS — G936 Cerebral edema: Secondary | ICD-10-CM | POA: Insufficient documentation

## 2013-01-30 DIAGNOSIS — I469 Cardiac arrest, cause unspecified: Secondary | ICD-10-CM

## 2013-01-30 HISTORY — DX: Eclampsia complicating labor: O15.1

## 2013-01-30 HISTORY — DX: Eclampsia complicating the puerperium: O15.2

## 2013-01-30 LAB — BASIC METABOLIC PANEL
Chloride: 101 mEq/L (ref 96–112)
Creatinine, Ser: 0.87 mg/dL (ref 0.50–1.10)
GFR calc Af Amer: 90 mL/min — ABNORMAL LOW (ref >90)
Potassium: 4.2 mEq/L (ref 3.5–5.1)
Sodium: 133 mEq/L — ABNORMAL LOW (ref 135–145)

## 2013-01-30 LAB — CBC
HCT: 35.4 % — ABNORMAL LOW (ref 36.0–46.0)
MCHC: 32.5 g/dL (ref 30.0–36.0)
Platelets: 199 10*3/uL (ref 150–400)
RDW: 14.3 % (ref 11.5–15.5)
WBC: 10.3 10*3/uL (ref 4.0–10.5)

## 2013-01-30 LAB — HEMOGLOBIN AND HEMATOCRIT, BLOOD
HCT: 34.8 % — ABNORMAL LOW (ref 36.0–46.0)
Hemoglobin: 11.6 g/dL — ABNORMAL LOW (ref 12.0–15.0)

## 2013-01-30 LAB — COMPREHENSIVE METABOLIC PANEL
AST: 127 U/L — ABNORMAL HIGH (ref 0–37)
Albumin: 2.1 g/dL — ABNORMAL LOW (ref 3.5–5.2)
BUN: 12 mg/dL (ref 6–23)
CO2: 22 mEq/L (ref 19–32)
Calcium: 8.4 mg/dL (ref 8.4–10.5)
Calcium: 7.8 mg/dL — ABNORMAL LOW (ref 8.4–10.5)
Creatinine, Ser: 1.03 mg/dL (ref 0.50–1.10)
Creatinine, Ser: 1.02 mg/dL (ref 0.50–1.10)
GFR calc Af Amer: 74 mL/min — ABNORMAL LOW (ref >90)
GFR calc non Af Amer: 63 mL/min — ABNORMAL LOW (ref >90)
GFR calc non Af Amer: 64 mL/min — ABNORMAL LOW (ref >90)
Glucose, Bld: 70 mg/dL (ref 70–99)
Total Protein: 5.2 g/dL — ABNORMAL LOW (ref 6.0–8.3)

## 2013-01-30 LAB — RPR: RPR Ser Ql: NONREACTIVE

## 2013-01-30 LAB — MAGNESIUM
Magnesium: 5.1 mg/dL — ABNORMAL HIGH (ref 1.5–2.5)
Magnesium: 5.6 mg/dL — ABNORMAL HIGH (ref 1.5–2.5)

## 2013-01-30 LAB — PROTEIN / CREATININE RATIO, URINE: Protein Creatinine Ratio: 3.05 — ABNORMAL HIGH (ref 0.00–0.15)

## 2013-01-30 LAB — MRSA PCR SCREENING: MRSA by PCR: NEGATIVE

## 2013-01-30 LAB — ABO/RH: ABO/RH(D): O POS

## 2013-01-30 MED ORDER — HYDRALAZINE HCL 20 MG/ML IJ SOLN
10.0000 mg | INTRAMUSCULAR | Status: DC | PRN
  Administered 2013-01-30 – 2013-01-31 (×3): 20 mg via INTRAVENOUS
  Administered 2013-01-31: 40 mg via INTRAVENOUS
  Administered 2013-01-31: 10 mg via INTRAVENOUS
  Administered 2013-01-31 – 2013-02-01 (×2): 20 mg via INTRAVENOUS
  Administered 2013-02-01: 40 mg via INTRAVENOUS
  Administered 2013-02-02: 20 mg via INTRAVENOUS
  Filled 2013-01-30: qty 1
  Filled 2013-01-30 (×2): qty 2
  Filled 2013-01-30 (×6): qty 1

## 2013-01-30 MED ORDER — ALPRAZOLAM 1 MG PO TABS
1.0000 mg | ORAL_TABLET | ORAL | Status: DC
  Administered 2013-01-30: 1 mg via ORAL
  Filled 2013-01-30: qty 2

## 2013-01-30 MED ORDER — GADOBENATE DIMEGLUMINE 529 MG/ML IV SOLN
20.0000 mL | Freq: Once | INTRAVENOUS | Status: AC | PRN
  Administered 2013-01-30: 17 mL via INTRAVENOUS

## 2013-01-30 MED ORDER — DIAZEPAM 2 MG PO TABS
2.0000 mg | ORAL_TABLET | Freq: Once | ORAL | Status: AC
  Administered 2013-01-30: 2 mg via ORAL
  Filled 2013-01-30: qty 1

## 2013-01-30 MED ORDER — OXYTOCIN 40 UNITS IN LACTATED RINGERS INFUSION - SIMPLE MED
62.5000 mL/h | INTRAVENOUS | Status: DC
  Administered 2013-01-30: 62.5 mL/h via INTRAVENOUS

## 2013-01-30 MED ORDER — ALPRAZOLAM 0.5 MG PO TABS: 1.0000 mg | ORAL_TABLET | Freq: Once | ORAL | Status: DC

## 2013-01-30 MED ORDER — LABETALOL HCL 200 MG PO TABS
200.0000 mg | ORAL_TABLET | Freq: Two times a day (BID) | ORAL | Status: DC
  Administered 2013-01-30 – 2013-02-04 (×10): 200 mg via ORAL
  Filled 2013-01-30 (×15): qty 1

## 2013-01-30 NOTE — Progress Notes (Signed)
Patient ID: Hannah Vega, female   DOB: July 13, 1966, 47 y.o.   MRN: [id number] POD # 1  Subjective: Pt remains occasionally confused and more so earlier this am according to husband, however, pt did recognize my face from office visit some months ago.  No c/o pain unless moving.  Taking ibuprofen at present. Foley in place, patent/ No n/v Activity: bedrest Bleeding is light Newborn info:  Information for the patient's newborn:  Jaelynn, Pozo [[id number]]  female Information for the patient's newborn:  Ludmila, Ebarb [[id number]]  female / Feeding: bottle   Objective: VS: Blood pressure 151/76, pulse 63, temperature 97.3 F (36.3 C), temperature source Oral, resp. rate 14.    Intake/Output Summary (Last 24 hours) at 01/30/13 1009 Last data filed at 01/30/13 0900  Gross per 24 hour  Intake 4193.48 ml  Output   1520 ml  Net 2673.48 ml   UOP: approx in past 2 hrs   Recent Labs  01/29/13 2210 01/30/13 0520  WBC 10.8* 10.3  HGB 11.8* 11.5*  HCT 36.9 35.4*  PLT 201 199   AST/ALT: 127/77 LDH: 347  Blood type: --/--/O POS (02/11 2210) Rubella: Immune (08/19 0000)    Physical Exam:  General: Alert and oriented x 3, however, "has moments" of confusion, but that seems to have improved in past few hours. CV: Regular rate and rhythm Resp: clear Abdomen: soft, nontender, normal bowel sounds Incision: covered with dressing; dry and intact Uterine Fundus: firm, below umbilicus, nontender Lochia: minimal Ext: edema +1, Homans sign is negative, no sign of DVT and Clonus +1-2 beats    A/P: POD # 1/ [id number] S/P C/Section d/t Twins (Twin A with IUGR) Hx HTN and on labetalol during pregnancy Eclampsia, s/p C/S EEG now and MRI later this am; continuing to r/o cardiac event, along with seizure activity Will review status and plan with MD.    Signed: Demetrius Revel, MSN, [facility] 01/30/2013, 10:09 AM

## 2013-01-30 NOTE — Progress Notes (Signed)
Patient ID: Hannah Vega, female   DOB: 1966/01/08, 47 y.o.   MRN: [id number] Saw pt this morning while she was getting set up for EEG in the room. Neurohospitalist consult appreciated, planning MRI at around 11 am.  Feels groggy but bit more rested. No surgical pain at present.  A&O but gets confused at times and has difficulty finding words.  BP 154/73  Pulse 65  Temp(Src) 97.3 F (36.3 C) (Oral)  Resp 16  Ht 5\' 3"  (1.6 m)  Wt 177 lb 6.4 oz (80.468 kg)  BMI 31.43 kg/m2  SpO2 96%  BP control much improved. I/O reviewed Gross neuro exam nl,no cranial nerves or limbs obvious deficit noted Lungs CTA bilat, no crackles CV RRR Abdo soft, dressing dry, uterus firm, 2cm below umb Extr no edema, no calf tenderness, DTR+2/+2  Urine (foley), clear in color  Labs- stable, slight rise in liver enzymes, LDH. H/H, Platelets and Creatinine stable.  EEG-report pending  MRI at 11 am, pt requesting pre-medication due to phobia/anxiety in closed space.   A/P: Postpartum seizure, Eclampsia, CT suspecting PRES. No further seizures after 1st seizure last evening.  Eclampsia- continue Magnesium sulfate at 2gm/hr rate for 48 hrs. No signs of toxicity, strict I/O.  Repeat labs in 12 hrs and then 24 hrs if stable. PRES; r/o infact- MRI today after 12 hrs of magnesium considering stable BP and labs, will be ok to transport CHTN - continue Labetalol 200mg  q12hr and increase if needed Telemetry nl, EKG nl, ABG nl last night, will stop Telemetry at this point Post-op H/H stable and nl platelets, low risk for post-op bleeding. Pain well controlled, no meds at present Twin girls, NICU stable.

## 2013-01-30 NOTE — Procedures (Signed)
EEG NUMBER:  HISTORY:  A 47 year old female with new onset seizures.  MEDICATIONS:  Mag sulfate, Normodyne, Pitocin.  CONDITIONS OF RECORDING:  This is a 16-channel EEG carried out with the patient in the awake, drowsy, and asleep states.  DESCRIPTION:  The waking background activity consists of a low-voltage, symmetrical, fairly well-organized 10 Hz alpha activity seen from the parieto-occipital and posterotemporal regions.  Low-voltage, fast activity, poorly organized is seen anteriorly, and at times, superimposed on more posterior rhythms.  A mixture of theta and alpha was seen from the central and temporal regions.  The patient drowses with slowing to irregular, low-voltage theta and beta activity.  The patient goes into a light sleep with symmetrical sleep spindles, vertex sharp activity, and irregular slow activity.  Hyperventilation and intermittent photic stimulation were not performed.  IMPRESSION:  This is a normal EEG.          ______________________________ Thana Farr, MD    OZ:HYQM D:  01/30/2013 19:52:43  T:  01/30/2013 23:15:07  Job #:  [id number]

## 2013-01-30 NOTE — Progress Notes (Addendum)
Patient ID: Hannah Vega, female   DOB: 1966-08-15, 47 y.o.   MRN: [id number] I got a phone call at home from 3rd floor staff at 10.04 pm to see a post C/section woman who had a witnessed seizure, code blue and was resuscitated in her room. Patient had PNCare with Dr Billy Coast, [facility] on Labetalol, BPs getting worse in last few days and had emergency C/s at 35 wks today due to IUGR. Her pre-op PIH labs were normal.   When I arrived at 10.20 pm to room 312, Dr Seymour Bars was in attendance who had come to see her post-op patient and I assumed care from her. Patient was receiving her 4gm bolus of Magnesium sulfate.    Pt was post-ictal, confused except for remembering her husband's name, slightly agitated from confusion.  VS reviewed, BP 170s/90s and O2 sa 99-100% with O2 mask, Pulse 70s.  No other focal neurologic deficit noted. Tongue slightly lacerated but no active bleeding.  Lungs clear bilateral CV RRR, EKG ordered- noted normal sinus rhythm. LE DTR +3/+3  Abdo soft, incision dry.   Labs were sent off by Code team incl CBC (stable, nl platelets, H/H), CMP (elevated liver enzymes and slightly elevated creatinine), uric acid (elevated) and LDH (elevated) and UA (protein 100) all c/w Eclampsia, hemolysis w normal platelets at present.  EKG - nl sinus rhythm ABG - slight acidosis and low carb, overall ok  20 mg Labetalol IV and 1 mg Ativan IV given. Additional 2 gm Magnesium bolus given (total 6 gm) since pt was still quite anxious/confused/agitated. Over next several minutes, additional IV 20 mg Labetalol, then 5 mg IV Hydralazine and then IV 40 mg Labetalol given eventually brought her BP down to 140-150s/80s  By the time patient was transferred to [facility] unit, with telemetry monitoring, she was more alert and oriented in time/place/person.  Once BP was controlled, she was transferred down for Head CT (without contrast) to r/o any hemorrhage considering her age and level of BP, even though findings c/w  Eclampsia.   A- Postpartum Eclampsia, superimposed on CHTN in pregnancy, unclear if she indeed had CP-arrest or more likely seizure related vital sign changes and post-ictal state.  Patient is stable now. No MI/PE on clinical and lab.EKG findings.  Plan- Seizure precautions, keep BP well controlled, continue Magnesium sulfate at 2 gm/hr rate, continue Pitocin in LR and total IVF at 125 cc/hr, watch I/O closely, dont remove foley until further orders.  Repeat all labs in 12 hrs.  Dr Billy Coast was informed, agrees.     CT back, spoke w Radiologist and Neurology consultant at 2.30 am who will see pt and make recommendations.  V.Stein Windhorst, MD

## 2013-01-30 NOTE — Progress Notes (Signed)
UR chart review completed.  

## 2013-01-30 NOTE — Progress Notes (Signed)
Subjective: Patient lethargic.  Has had multiple sedating medications.  MRI of the brain performed and shows bilateral occipital edema.  Findings are consistent with PRES.  No further seizures noted.  Patient on Magnesium.  BP improved.    Objective: Current vital signs: BP 161/75  Pulse 57  Temp(Src) 97.6 F (36.4 C) (Oral)  Resp 14  Ht 5\' 3"  (1.6 m)  Wt 80.468 kg (177 lb 6.4 oz)  BMI 31.43 kg/m2  SpO2 98% Vital signs in last 24 hours: Temp:  [97.3 F (36.3 C)-98.3 F (36.8 C)] 97.6 F (36.4 C) (02/12 1600) Pulse Rate:  [57-83] 57 (02/12 1600) Resp:  [12-26] 14 (02/12 1445) BP: (119-186)/(70-98) 161/75 mmHg (02/12 1608) SpO2:  [92 %-100 %] 98 % (02/12 1600) Weight:  [78.109 kg (172 lb 3.2 oz)-80.468 kg (177 lb 6.4 oz)] 80.468 kg (177 lb 6.4 oz) (02/12 0657)  Intake/Output from previous day: 02/11 0701 - 02/12 0700 In: 3942.5 [P.O.:240; I.V.:3702.5] Out: 1445 [Urine:645; Blood:800] Intake/Output this shift: Total I/O In: 437.3 [I.V.:437.3] Out: 665 [Urine:665] Nutritional status:    Neurologic Exam: Mental Status:  Lethargic.  Able to give me her name but quickly goes back to sleep.  Speech minimal.  Able to follow simple commands without difficulty.  Cranial Nerves:  II: Discs flat bilaterally; Does not respond to confrontation III,IV, VI: ptosis not present, Doll's response intact V,VII: smile symmetric VIII: hearing normal bilaterally  IX,X: gag reflex present  XI: bilateral shoulder shrug  XII: midline tongue extension  Motor:  Able to lift all extremities against gravity  Tone and bulk:normal tone throughout; no atrophy noted  Sensory: Responds to noxious stimuli throughout Deep Tendon Reflexes: 2+ in the upper extremities, absent at the knees and 2+ at the ankles.    Plantars:  Right: downgoing   Left: downgoing  Lab Results: Basic Metabolic Panel:  Recent Labs Lab 01/29/13 1735 01/29/13 2210 01/30/13 0520 01/30/13 1521  NA 138 138 137 133*  K 4.4  4.4 4.1 4.0  CL 103 100 103 100  CO2 19 18* 22 22  GLUCOSE 70 90 76 70  BUN 9 10 10 12   CREATININE 0.99 1.05 1.03 1.02  CALCIUM 9.7 8.8 8.4 7.8*  MG  --   --  5.1* 4.2*    Liver Function Tests:  Recent Labs Lab 01/29/13 1735 01/29/13 2210 01/30/13 0520 01/30/13 1521  AST 35 125* 127* 104*  ALT 23 78* 77* 65*  ALKPHOS 247* 223* 201* 183*  BILITOT 0.4 0.2* 0.3 0.3  PROT 6.1 5.3* 5.5* 5.2*  ALBUMIN 2.5* 2.1* 2.1* 2.0*   No results found for this basename: LIPASE, AMYLASE,  in the last 168 hours No results found for this basename: AMMONIA,  in the last 168 hours  CBC:  Recent Labs Lab 01/29/13 1735 01/29/13 2210 01/30/13 0520  WBC 9.0 10.8* 10.3  HGB 12.0 11.8* 11.5*  HCT 36.7 36.9 35.4*  MCV 81.0 83.1 81.0  PLT 216 201 199    Cardiac Enzymes:  Recent Labs Lab 01/29/13 2210  TROPONINI <0.30    Lipid Panel: No results found for this basename: CHOL, TRIG, HDL, CHOLHDL, VLDL, LDLCALC,  in the last 168 hours  CBG:  Recent Labs Lab 01/29/13 2224  GLUCAP 70    Microbiology: Results for orders placed during the hospital encounter of 01/29/13  MRSA PCR SCREENING     Status: None   Collection Time    01/30/13  3:00 AM      Result Value Range  Status   MRSA by PCR NEGATIVE  NEGATIVE Final   Comment:            The GeneXpert MRSA Assay (FDA     approved for NASAL specimens     only), is one component of a     comprehensive MRSA colonization     surveillance program. It is not     intended to diagnose MRSA     infection nor to guide or     monitor treatment for     MRSA infections.    Coagulation Studies: No results found for this basename: LABPROT, INR,  in the last 72 hours  Imaging: Ct Head Wo Contrast  01/30/2013  *RADIOLOGY REPORT*  Clinical Data: Seizure after cesarean section.  CT HEAD WITHOUT CONTRAST  Technique:  Contiguous axial images were obtained from the base of the skull through the vertex without contrast.  Comparison: 12/25/2009   Findings:  A greater than expected hypodensity in the occipital lobes bilaterally could reflect infarct or posterior reversible encephalopathy (PRES). The latter is favored.  No mass lesion or intracranial hemorrhage observed.  The brain stem, cerebellum, cerebral peduncles, thalami, basal ganglia, ventricular system, and basilar cisterns appear otherwise normal.  IMPRESSION: 1.  Subtle hypodensity in the occipital lobes bilaterally may be manifestation of posterior reversible encephalopathy syndrome (PRES) or less likely occipital lobe infarct.  Correlate with visual fields assessment and consider MRI of the brain for further characterization.  I discussed these findings by telephone with Dr. Shea Evans at 12:55 p.m. on 01/30/2013.   Original Report Authenticated By: Gaylyn Rong, M.D.    Mr Laqueta Jean Wo Contrast  01/30/2013  *RADIOLOGY REPORT*  Clinical Data: Recent childbirth.  Cardiopulmonary arrest.  MRI HEAD WITHOUT AND WITH CONTRAST  Technique:  Multiplanar, multiecho pulse sequences of the brain and surrounding structures were obtained according to standard protocol without and with intravenous contrast  Contrast: 17mL MULTIHANCE GADOBENATE DIMEGLUMINE 529 MG/ML IV SOLN  Comparison: Head CT 01/29/2013.  Findings: Diffusion imaging does not show any acute or subacute infarction.  The brainstem and cerebellum are normal.  There is abnormal edema in the occipital and posterior parietal brain right more than left.  In this setting, this is most consistent with posterior reversible encephalopathy.  No hemorrhage.  No mass effect or shift.  No mass lesion, hydrocephalus or extra-axial collection.  There is mild mucosal inflammation of the paranasal sinuses.  No skull or skull base lesion.  Major vessels are patent at the base of the brain.  After contrast administration, no abnormal enhancement occurs.  Major venous structures are patent.  IMPRESSION: Edema in the occipital lobes and to a lesser extent  the parietal lobes bilaterally, right more than left, most consistent with posterior reversible encephalopathy.  None of these areas shows restricted diffusion to suggest actual infarction.  No hemorrhage.   Original Report Authenticated By: Paulina Fusi, M.D.     Medications:  I have reviewed the patient's current medications. Scheduled: . ibuprofen  600 mg Oral Q6H  . labetalol  200 mg Oral BID  . magnesium  4 g Intravenous Once  . oxymetazoline  1 spray Each Nare BID  . prenatal multivitamin  1 tablet Oral Daily  . scopolamine  1 patch Transdermal Once  . senna-docusate  2 tablet Oral QHS  . simethicone  80 mg Oral TID PC & HS    Assessment/Plan: Patient with eclampsia and PRES.  Has had one seizure.  No further seizures noted.  EEG reviewed.  No epileptiform activity noted.  On magnesium.  Recommendations: 1.  Case discussed with family, OB and with CCM.  It was determined that the patient would be transferred to NICU at [facility] for continued treatment.  Transfer orders placed and [facility] contacted. 2.  Continued BP control 3.  No AED's at this time.   4.  Patient likely lethargic from the plethora of sedating medications that have used in the past 24 hours.  Would hold sedating medications at this time.      LOS: 1 day   Thana Farr, MD [facility] [phone] 01/30/2013  5:42 PM

## 2013-01-30 NOTE — Consult Note (Signed)
NEURO HOSPITALIST CONSULT NOTE    Reason for Consult: new onset generalized tonic clonic seizure.  HPI:                                                                                                                                          Hannah Vega is an 47 y.o. female with a past medical history significant for hypertension who underwent a C-section and couple of hours later sustained an isolated generalized convulsion. She has been having difficulty with blood pressure control in the last weeks of pregnancy. No prior history off seizures. Hannah Vega stated that she did not have any warning before the seizure and has no recollection of the event. Afterwards, she was confused and disoriented for a short period of time. She did bite her tongue. She was loaded with IV magnesium and hasn't had further seizures ever since. Currently getting IV magnesium 2 grams per hour. Urgent CT brain showed subtle hypodensity in the occipital lobes bilaterally concerning for posterior reversible encephalopathy syndrome (PRES) or less likely occipital lobe infarct. Hannah Vega denies recent fever, infection, skin rash, severe head injury, history of febrile seizures, CNS infection, or stroke. No family history of epilepsy. At this moment, she denies headache, visual disturbances, vertigo, focal weakness or numbness.    Past Medical History  Diagnosis Date  . Hypertension   . Female infertility   . Eclampsia in delivered patient 01/30/2013    Past Surgical History  Procedure Laterality Date  . Tympanostomy tube placement    . Tonsillectomy      History reviewed. No pertinent family history.  Family History: no epilepsy.  Social History:  reports that she has never smoked. She does not have any smokeless tobacco history on file. She reports that she does not drink alcohol or use illicit drugs.  Allergies  Allergen Reactions  . Codeine Nausea And Vomiting  . Erythromycin Nausea  And Vomiting    MEDICATIONS:                                                                                                                     I have reviewed the patient's current medications.   ROS:  History obtained from the patient, husband, and chart review.   General ROS: negative for - chills, fatigue, fever, night sweats, weight gain or weight loss Psychological ROS: negative for - behavioral disorder, hallucinations, memory difficulties, mood swings or suicidal ideation Ophthalmic ROS: negative for - blurry vision, double vision, eye pain or loss of vision ENT ROS: negative for - epistaxis, nasal discharge, oral lesions, sore throat, tinnitus or vertigo Allergy and Immunology ROS: negative for - hives or itchy/watery eyes Hematological and Lymphatic ROS: negative for - bleeding problems, bruising or swollen lymph nodes Endocrine ROS: negative for - galactorrhea, hair pattern changes, polydipsia/polyuria or temperature intolerance Respiratory ROS: negative for - cough, hemoptysis, shortness of breath or wheezing Cardiovascular ROS: negative for - chest pain, dyspnea on exertion, edema or irregular heartbeat Gastrointestinal ROS: negative for - abdominal pain, diarrhea, hematemesis, nausea/vomiting or stool incontinence Genito-Urinary ROS: negative for - dysuria, hematuria, incontinence or urinary frequency/urgency Musculoskeletal ROS: negative for - joint swelling or muscular weakness Neurological ROS: as noted in HPI Dermatological ROS: negative for rash and skin lesion changes  Physicla exam: pleasant female in no apparent distress. Blood pressure 135/75, pulse 69, temperature 97.5 F (36.4 C), temperature source Oral, resp. rate 13, height 5\' 3"  (1.6 m), weight 78.109 kg (172 lb 3.2 oz), SpO2 94.00%. Head: normocephalic. Neck: supple. Cardiac:  no murmurs. Lungs: clear. Abdomen: soft. Extremities: no edema.  Neurologic Examination:                                                                                                      Mental Status: Alert, awake,oriented x 4, thought content appropriate.  Speech fluent without evidence of aphasia.  Able to follow 3 step commands without difficulty. Cranial Nerves: II: Discs flat bilaterally; Visual fields grossly normal, pupils equal, round, reactive to light and accommodation III,IV, VI: ptosis not present, extra-ocular motions intact bilaterally V,VII: smile symmetric, facial light touch sensation normal bilaterally VIII: hearing normal bilaterally IX,X: gag reflex present XI: bilateral shoulder shrug XII: midline tongue extension Motor: Right : Upper extremity   5/5    Left:     Upper extremity   5/5  Lower extremity   5/5     Lower extremity   5/5 Tone and bulk:normal tone throughout; no atrophy noted Sensory: Pinprick and light touch intact throughout, bilaterally Deep Tendon Reflexes: 2+ and symmetric throughout Plantars: Right: downgoing   Left: downgoing Cerebellar: normal finger-to-nose,  normal heel-to-shin test Gait: no tested. CV: pulses palpable throughout    No results found for this basename: cbc, bmp, coags, chol, tri, ldl, hga1c    Results for orders placed during the hospital encounter of 01/29/13 (from the past 48 hour(s))  CBC     Status: None   Collection Time    01/29/13  5:35 PM      Result Value Range   WBC 9.0  4.0 - 10.5 K/uL   RBC 4.53  3.87 - 5.11 MIL/uL   Hemoglobin 12.0  12.0 - 15.0 g/dL   HCT 46.9  62.9 - 52.8 %   MCV 81.0  78.0 - 100.0 fL   MCH 26.5  26.0 - 34.0 pg   MCHC 32.7  30.0 - 36.0 g/dL   RDW 19.1  47.8 - 29.5 %   Platelets 216  150 - 400 K/uL  COMPREHENSIVE METABOLIC PANEL     Status: Abnormal   Collection Time    01/29/13  5:35 PM      Result Value Range   Sodium 138  135 - 145 mEq/L   Potassium 4.4  3.5 - 5.1 mEq/L    Chloride 103  96 - 112 mEq/L   CO2 19  19 - 32 mEq/L   Glucose, Bld 70  70 - 99 mg/dL   BUN 9  6 - 23 mg/dL   Creatinine, Ser 6.21  0.50 - 1.10 mg/dL   Calcium 9.7  8.4 - 30.8 mg/dL   Total Protein 6.1  6.0 - 8.3 g/dL   Albumin 2.5 (*) 3.5 - 5.2 g/dL   AST 35  0 - 37 U/L   ALT 23  0 - 35 U/L   Alkaline Phosphatase 247 (*) 39 - 117 U/L   Total Bilirubin 0.4  0.3 - 1.2 mg/dL   GFR calc non Af Amer 66 (*) >90 mL/min   GFR calc Af Amer 77 (*) >90 mL/min   Comment:            The eGFR has been calculated     using the CKD EPI equation.     This calculation has not been     validated in all clinical     situations.     eGFR's persistently     <90 mL/min signify     possible Chronic Kidney Disease.  RPR     Status: None   Collection Time    01/29/13  5:35 PM      Result Value Range   RPR NON REACTIVE  NON REACTIVE  URINALYSIS, ROUTINE W REFLEX MICROSCOPIC     Status: Abnormal   Collection Time    01/29/13 10:05 PM      Result Value Range   Color, Urine YELLOW  YELLOW   APPearance CLEAR  CLEAR   Specific Gravity, Urine 1.015  1.005 - 1.030   pH 6.0  5.0 - 8.0   Glucose, UA NEGATIVE  NEGATIVE mg/dL   Hgb urine dipstick MODERATE (*) NEGATIVE   Bilirubin Urine NEGATIVE  NEGATIVE   Ketones, ur 15 (*) NEGATIVE mg/dL   Protein, ur 657 (*) NEGATIVE mg/dL   Urobilinogen, UA 0.2  0.0 - 1.0 mg/dL   Nitrite NEGATIVE  NEGATIVE   Leukocytes, UA NEGATIVE  NEGATIVE  URINE MICROSCOPIC-ADD ON     Status: Abnormal   Collection Time    01/29/13 10:05 PM      Result Value Range   Squamous Epithelial / LPF RARE  RARE   WBC, UA 0-2  <3 WBC/hpf   RBC / HPF 7-10  <3 RBC/hpf   Bacteria, UA FEW (*) RARE  PROTEIN / CREATININE RATIO, URINE     Status: Abnormal   Collection Time    01/29/13 10:08 PM      Result Value Range   Creatinine, Urine 71.18     Total Protein, Urine 217.1     Comment: NO NORMAL RANGE ESTABLISHED FOR THIS TEST   PROTEIN CREATININE RATIO 3.05 (*) 0.00 - 0.15  CBC      Status: Abnormal   Collection Time    01/29/13 10:10 PM  Result Value Range   WBC 10.8 (*) 4.0 - 10.5 K/uL   RBC 4.44  3.87 - 5.11 MIL/uL   Hemoglobin 11.8 (*) 12.0 - 15.0 g/dL   HCT 16.1  09.6 - 04.5 %   MCV 83.1  78.0 - 100.0 fL   MCH 26.6  26.0 - 34.0 pg   MCHC 32.0  30.0 - 36.0 g/dL   RDW 40.9  81.1 - 91.4 %   Platelets 201  150 - 400 K/uL  COMPREHENSIVE METABOLIC PANEL     Status: Abnormal   Collection Time    01/29/13 10:10 PM      Result Value Range   Sodium 138  135 - 145 mEq/L   Potassium 4.4  3.5 - 5.1 mEq/L   Chloride 100  96 - 112 mEq/L   CO2 18 (*) 19 - 32 mEq/L   Glucose, Bld 90  70 - 99 mg/dL   BUN 10  6 - 23 mg/dL   Creatinine, Ser 7.82  0.50 - 1.10 mg/dL   Calcium 8.8  8.4 - 95.6 mg/dL   Total Protein 5.3 (*) 6.0 - 8.3 g/dL   Albumin 2.1 (*) 3.5 - 5.2 g/dL   AST 213 (*) 0 - 37 U/L   ALT 78 (*) 0 - 35 U/L   Alkaline Phosphatase 223 (*) 39 - 117 U/L   Total Bilirubin 0.2 (*) 0.3 - 1.2 mg/dL   GFR calc non Af Amer 62 (*) >90 mL/min   GFR calc Af Amer 72 (*) >90 mL/min   Comment:            The eGFR has been calculated     using the CKD EPI equation.     This calculation has not been     validated in all clinical     situations.     eGFR's persistently     <90 mL/min signify     possible Chronic Kidney Disease.  URIC ACID     Status: None   Collection Time    01/29/13 10:10 PM      Result Value Range   Uric Acid, Serum 6.2  2.4 - 7.0 mg/dL  LACTATE DEHYDROGENASE     Status: Abnormal   Collection Time    01/29/13 10:10 PM      Result Value Range   LDH 308 (*) 94 - 250 U/L  TROPONIN I     Status: None   Collection Time    01/29/13 10:10 PM      Result Value Range   Troponin I <0.30  <0.30 ng/mL   Comment:            Due to the release kinetics of cTnI,     a negative result within the first hours     of the onset of symptoms does not rule out     myocardial infarction with certainty.     If myocardial infarction is still suspected,      repeat the test at appropriate intervals.  ABO/RH     Status: None   Collection Time    01/29/13 10:10 PM      Result Value Range   ABO/RH(D) O POS    BLOOD GAS, ARTERIAL     Status: Abnormal   Collection Time    01/29/13 10:22 PM      Result Value Range   FIO2 0.21     Delivery systems ROOM AIR     pH, Arterial 7.343 (*) 7.350 -  7.450   pCO2 arterial 35.3  35.0 - 45.0 mmHg   pO2, Arterial 85.0  80.0 - 100.0 mmHg   Bicarbonate 18.7 (*) 20.0 - 24.0 mEq/L   TCO2 19.7  0 - 100 mmol/L   Acid-base deficit 5.8 (*) 0.0 - 2.0 mmol/L   O2 Saturation 97.0     Collection site RADIAL     Drawn by [phone]     Sample type ARTERIAL     Allens test (pass/fail) PASS  PASS  GLUCOSE, CAPILLARY     Status: None   Collection Time    01/29/13 10:24 PM      Result Value Range   Glucose-Capillary 70  70 - 99 mg/dL  MRSA PCR SCREENING     Status: None   Collection Time    01/30/13  3:00 AM      Result Value Range   MRSA by PCR NEGATIVE  NEGATIVE   Comment:            The GeneXpert MRSA Assay (FDA     approved for NASAL specimens     only), is one component of a     comprehensive MRSA colonization     surveillance program. It is not     intended to diagnose MRSA     infection nor to guide or     monitor treatment for     MRSA infections.    Ct Head Wo Contrast  01/30/2013  *RADIOLOGY REPORT*  Clinical Data: Seizure after cesarean section.  CT HEAD WITHOUT CONTRAST  Technique:  Contiguous axial images were obtained from the base of the skull through the vertex without contrast.  Comparison: 12/25/2009  Findings:  A greater than expected hypodensity in the occipital lobes bilaterally could reflect infarct or posterior reversible encephalopathy (PRES). The latter is favored.  No mass lesion or intracranial hemorrhage observed.  The brain stem, cerebellum, cerebral peduncles, thalami, basal ganglia, ventricular system, and basilar cisterns appear otherwise normal.  IMPRESSION: 1.  Subtle hypodensity in  the occipital lobes bilaterally may be manifestation of posterior reversible encephalopathy syndrome (PRES) or less likely occipital lobe infarct.  Correlate with visual fields assessment and consider MRI of the brain for further characterization.  I discussed these findings by telephone with Dr. Shea Evans at 12:55 p.m. on 01/30/2013.   Original Report Authenticated By: Gaylyn Rong, M.D.      Assessment/Plan: 47 years old female without known risk factors for epilepsy who few hours ago sustained her first seizure in life, most likely an eclamptic seizure associated with PRES. Will suggest MRI brain and EEG when patient stable. Agree with magnesium. Will follow up with you.  Wyatt Portela, MD [facility] [phone]  01/30/2013, 5:17 AM

## 2013-01-30 NOTE — Progress Notes (Signed)
Contacted [facility] radiology department re orders for EEG, MRI of the head ordered for today.

## 2013-01-30 NOTE — Lactation Note (Signed)
This note was copied from the chart of Hannah Vega. Lactation Consultation Note  Patient Name: Hannah Dalissa Milstein Today's Date: 01/30/2013     Maternal Data Formula Feeding for Exclusion: Yes Reason for exclusion: Mother's choice to forumla feed on admision  Feeding Feeding Type: Formula Feeding method: Tube/Gavage Length of feed: 30 min  LATCH Score/Interventions                      Lactation Tools Discussed/Used     Consult Status      Kaho Selle Anne 01/30/2013, 1:09 PM    

## 2013-01-30 NOTE — Anesthesia Postprocedure Evaluation (Signed)
  Anesthesia Post-op Note  Patient: Hannah Vega  Procedure(s) Performed: Procedure(s) with comments: CESAREAN SECTION  twins 35 weeks (N/A) - Primary C/S   EDD: 03/05/13  Patient Location: PACU and A-ICU  Anesthesia Type:Spinal  Level of Consciousness: awake, oriented and patient cooperative  Airway and Oxygen Therapy: Patient Spontanous Breathing  Post-op Pain: mild  Post-op Assessment: Patient's Cardiovascular Status Stable, Respiratory Function Stable, Patent Airway, No signs of Nausea or vomiting and Adequate PO intake;drowsy, slightly confused per husband  Post-op Vital Signs: Reviewed and stable  Complications: No anesthetic complications noted; seizure last p.m., remains on Magnesium infusion.

## 2013-01-30 NOTE — Progress Notes (Signed)
Arrived to patient's room to find her agonally breathing and being bagged ventilated by floor RN.  Anesthesia was arriving as well as MAU midwife.  I immediately put EKG leads and attached Zoll.  She was SR in the 70's. Patient's RN says that she became unresponsive, turned blue and progressed to arrest with no palpable pulse.  After being bag mask ventilated for approx.1 minute she began to awaken and increase respirations.  Orders were given for 4 gm Magnesium bolus which was initiated as charted. Patients BP 174/80 and 200/82 several minutes later.  O2 sats 98 percent on partial rebreather.   Dr. Seymour Bars arrived and shortly after Dr. Juliene Pina. Orders received for IV Labetolol and administered as ordered.  Patient was very confused, oriented only to person and seemed post ictal.  Please see CPR record for details of incident.  Patient was moved to ICU at approx. 2305.

## 2013-01-30 NOTE — Progress Notes (Signed)
Patient ID: Hannah Vega, female   DOB: June 19, 1966, 47 y.o.   MRN: [id number] MRI- Impression- Edema in the occipital lobes and to a lesser extent the parietal lobes bilaterally, right more than left, most consistent with posterior reversible encephalopathy. None of these areas shows restricted diffusion to suggest actual infarction. No hemorrhage.  Neuro-hospitalist at bedside. Planning transfer to [facility] for PRES management. D/w husband, who prefers transfer to [facility] as well. Will co-ordinate. Continue Magnesium sulfate for 36-48hrs, strict I/O. D/w Intensivist on phone. Ob service will be on call as needed.   V.Isador Castille, MD

## 2013-01-30 NOTE — Progress Notes (Signed)
Patient in transport via [facility] to 3109 at [facility], husband in attendance as well.

## 2013-01-30 NOTE — Consult Note (Signed)
PULMONARY  / CRITICAL CARE MEDICINE  Name: Hannah Vega MRN: [id number] DOB: October 16, 1966    ADMISSION DATE:  01/29/2013 CONSULTATION DATE:  2/12  REFERRING MD :  Thad Ranger, Neurology  CHIEF COMPLAINT:  Eclampsia  BRIEF PATIENT DESCRIPTION: 47 y/o female with HTN who delivered twins via C-section on 2/11 developed seizures and eclampsia two hours post op.  Transferred to [facility] on 2/12 and PCCM asked to admit with neurology consulting.  SIGNIFICANT EVENTS / STUDIES:  2/11 c-section 2/11 2230, seizure, hypoxemic, cardiac arrest, CPR x4 minutes 2/11 CT head>> PRES vs. Occipital lobe infarct 2/11 MRI brain>> PRES  LINES / TUBES: Peripheral IV  CULTURES: none  ANTIBIOTICS: none  HISTORY OF PRESENT ILLNESS:  47 y/o female with HTN who delivered twins via C-section on 2/11 developed seizures and eclampsia two hours post op.  Transferred to [facility] on 2/12 and PCCM asked to admit with neurology consulting.   Approximately 2 hours post op she developed hypoxemia and then seizure like activity witnessed by her nurse.  She then developed cardiac arrest (PEA?) and received 4 minutes of CPR and regained spontaneous respirations afterwards.  She was never intubated.  She was given 4gm magnesium and started on a magnesium gtt.  Imaging studies showed evidence of PRES (CT head and MRI).  She was transferred to [facility] on 2/12 at family request.  She has not had seizures since 2/11.  PAST MEDICAL HISTORY :  Past Medical History  Diagnosis Date  . Hypertension   . Female infertility   . Eclampsia in delivered patient 01/30/2013   Past Surgical History  Procedure Laterality Date  . Tympanostomy tube placement    . Tonsillectomy     Prior to Admission medications   Medication Sig Start Date End Date Taking? Authorizing Provider  calcium carbonate (TUMS) 500 MG chewable tablet Chew 2 tablets by mouth 3 (three) times daily as needed for heartburn.   Yes Historical Provider, MD  diphenhydrAMINE (BENADRYL) 2  % cream Apply 1 application topically as needed for itching (to wrist).   Yes Historical Provider, MD  labetalol (NORMODYNE) 100 MG tablet Take 200 mg by mouth 2 (two) times daily. Pt takes about 5:30am and 5:30 pm. For itchy hands   Yes Historical Provider, MD  labetalol (NORMODYNE) 100 MG tablet Take 100 mg by mouth daily. Takes in the afternoon about 1:30pm   Yes Historical Provider, MD  phenylephrine (NEO-SYNEPHRINE) 0.5 % nasal solution Place 1 drop into the nose every 4 (four) hours as needed for congestion.   Yes Historical Provider, MD  Prenatal Vit-Fe Fumarate-FA (PRENATAL MULTIVITAMIN) TABS Take 1 tablet by mouth daily.   Yes Historical Provider, MD   Allergies  Allergen Reactions  . Codeine Nausea And Vomiting  . Erythromycin Nausea And Vomiting    FAMILY HISTORY:  History reviewed. No pertinent family history. SOCIAL HISTORY:  reports that she has never smoked. She does not have any smokeless tobacco history on file. She reports that she does not drink alcohol or use illicit drugs.  REVIEW OF SYSTEMS:   Gen: Denies fever, chills, weight change, fatigue, night sweats HEENT: Denies blurred vision, double vision, hearing loss, tinnitus, sinus congestion, rhinorrhea, sore throat, neck stiffness, dysphagia PULM: Denies shortness of breath, cough, sputum production, hemoptysis, wheezing CV: Denies chest pain, edema, orthopnea, paroxysmal nocturnal dyspnea, palpitations GI: Denies abdominal pain, nausea, vomiting, diarrhea, hematochezia, melena, constipation, change in bowel habits GU: Denies dysuria, hematuria, polyuria, oliguria, urethral discharge Endocrine: Denies hot or cold intolerance, polyuria,  polyphagia or appetite change Derm: Denies rash, dry skin, scaling or peeling skin change Heme: Denies easy bruising, bleeding, bleeding gums Neuro: per HPI, denies headache, numbness, weakness, slurred speech, loss of memory or consciousness   SUBJECTIVE:   VITAL SIGNS: Temp:   [97.3 F (36.3 C)-98.3 F (36.8 C)] 97.6 F (36.4 C) (02/12 1600) Pulse Rate:  [57-83] 57 (02/12 1600) Resp:  [12-26] 18 (02/12 1749) BP: (119-186)/(70-93) 160/81 mmHg (02/12 1749) SpO2:  [92 %-100 %] 98 % (02/12 1600) Weight:  [78.109 kg (172 lb 3.2 oz)-80.468 kg (177 lb 6.4 oz)] 80.468 kg (177 lb 6.4 oz) (02/12 0657) HEMODYNAMICS:   VENTILATOR SETTINGS:   INTAKE / OUTPUT: Intake/Output     02/11 0701 - 02/12 0700 02/12 0701 - 02/13 0700   P.O. 240    I.V. (mL/kg) 3702.5 (46) 935.4 (11.6)   Total Intake(mL/kg) 3942.5 (49) 935.4 (11.6)   Urine (mL/kg/hr) 645 665 (0.7)   Blood 800    Total Output 1445 665   Net +2497.5 +270.4          PHYSICAL EXAMINATION: Gen: drowsy but easily aroused, no acute distress HEENT: NCAT, PERRL, EOMi, OP clear, neck supple without masses PULM: CTA B CV: RRR, gallop LLSB, no JVD AB: BS+, soft, nontender, no hsm Ext: warm, trace edema, no clubbing, no cyanosis Derm: no rash or skin breakdown Neuro: Drowsy, arouses to voice, A&Ox4, maew   LABS:  Recent Labs Lab 01/29/13 1735 01/29/13 2210 01/29/13 2222 01/30/13 0520 01/30/13 1521  HGB 12.0 11.8*  --  11.5*  --   WBC 9.0 10.8*  --  10.3  --   PLT 216 201  --  199  --   NA 138 138  --  137 133*  K 4.4 4.4  --  4.1 4.0  CL 103 100  --  103 100  CO2 19 18*  --  22 22  GLUCOSE 70 90  --  76 70  BUN 9 10  --  10 12  CREATININE 0.99 1.05  --  1.03 1.02  CALCIUM 9.7 8.8  --  8.4 7.8*  MG  --   --   --  5.1* 4.2*  AST 35 125*  --  127* 104*  ALT 23 78*  --  77* 65*  ALKPHOS 247* 223*  --  201* 183*  BILITOT 0.4 0.2*  --  0.3 0.3  PROT 6.1 5.3*  --  5.5* 5.2*  ALBUMIN 2.5* 2.1*  --  2.1* 2.0*  TROPONINI  --  <0.30  --   --   --   PHART  --   --  7.343*  --   --   PCO2ART  --   --  35.3  --   --   PO2ART  --   --  85.0  --   --     Recent Labs Lab 01/29/13 2224  GLUCAP 70    CXR: none  ASSESSMENT / PLAN:  NEUROLOGIC A:  PRES/Eclampsia P:   -magnesium gtt overnight,  decrease rate or hold if RR < 12, UOP > 100cc/hr, no deep tendon reflexes -hold sedating meds per neurology -Keep SBP < 160 or MAP < 110 with PRN hydralazine -other care per neurology  -check Mg level now, hold Mg gtt if level > 8   PULMONARY A: sensation of dyspnea, not hypoxemic, lungs clear; likely related in part to nasal congestion and anxiety P:   -monitor respiratory status/O2 saturation in ICU -prn afrin  for sinus congestion -saline sprays for sinus congestion  CARDIOVASCULAR A: Hypertension Cardiac arrest 2/11 in setting of hypoxemic respiratory failure after seizure; no evidence of cardiac failure on exam 2/12 Eclampsia P:  -Keep SBP < 160 or MAP < 110 with PRN hydralazine -continue labetalol 200mg  po bid -echo 2/13  RENAL A:  No acute issues P:   -monitor Mg as above  GASTROINTESTINAL A:  No acute issues P:   -npo until 2/13 due to seizures  HEMATOLOGIC A:  Mild anemia, no active bleeding P:  -monitor H/H  INFECTIOUS A:  No acute issues P:   -monitor fever curve, WBC  ENDOCRINE A:  No acute issues P:     TODAY'S SUMMARY: 48 y/o female with eclampsia on 2/11 with seizure and cardiac arrest due to hypoxemic respiratory failure.  2/12 transferred to [facility] stable on Mg gtt neurologically intact and hemodynamically stable.  Plan to continue Mg gtt overnight, PRES/eclampsia management per neurology, check echo 2/13 given cardiac arrest.  I have personally obtained a history, examined the patient, evaluated laboratory and imaging results, formulated the assessment and plan and placed orders. CRITICAL CARE: The patient is critically ill with multiple organ systems failure and requires high complexity decision making for assessment and support, frequent evaluation and titration of therapies, application of advanced monitoring technologies and extensive interpretation of multiple databases. Critical Care Time devoted to patient care services described in this note  is 90 minutes.   Fonnie Jarvis Pulmonary and Critical Care Medicine [facility] Pager: [phone]  01/30/2013, 6:02 PM

## 2013-01-30 NOTE — Progress Notes (Signed)
Pt rec'd in bed from [facility] to room 372 AICU. Dr. Mitzi Hansen present with pt on admission.   O2 per NRB mask at 6L/min.  Telemetry leads applied per orders. VS obtained.  SR up x 2.  Foley catheter patent and to BSD.  Pt alert and oriented on admission, anxious re "my nose is stuffed up and I can't breathe", asking repeatedly for Afrin nasal spray.  IVF LR inf at 162ml/hr and Magnesium Sulfate inf at 2gms/64ml/hr (rec'd  Magnesium Bolus prior to transfer) on admission.  SCD's on.  Husband present.

## 2013-01-30 NOTE — Progress Notes (Signed)
Portable adult EEG completed.

## 2013-01-30 NOTE — Progress Notes (Signed)
Patient taken by [facility] to MRI at [facility]. Magnesium cut off as well as all other IVF for transport, will restart when patient returns, DR.Mody approved this.

## 2013-01-30 NOTE — Progress Notes (Addendum)
1445-Patient returned via [facility] from [facility] to room 372 from having MRI. Dr Thad Ranger, neurologist and Dr.Mody, OB at bedside. Magnesium restarted at 1600

## 2013-01-31 ENCOUNTER — Other Ambulatory Visit (HOSPITAL_COMMUNITY): Payer: 59

## 2013-01-31 ENCOUNTER — Encounter (HOSPITAL_COMMUNITY): Payer: Self-pay | Admitting: Obstetrics and Gynecology

## 2013-01-31 LAB — COMPREHENSIVE METABOLIC PANEL
ALT: 61 U/L — ABNORMAL HIGH (ref 0–35)
AST: 79 U/L — ABNORMAL HIGH (ref 0–37)
CO2: 20 mEq/L (ref 19–32)
Calcium: 7 mg/dL — ABNORMAL LOW (ref 8.4–10.5)
GFR calc non Af Amer: 79 mL/min — ABNORMAL LOW (ref >90)
Sodium: 134 mEq/L — ABNORMAL LOW (ref 135–145)

## 2013-01-31 LAB — CBC
MCV: 80.2 fL (ref 78.0–100.0)
Platelets: 236 10*3/uL (ref 150–400)
RDW: 14.5 % (ref 11.5–15.5)
WBC: 10.7 10*3/uL — ABNORMAL HIGH (ref 4.0–10.5)

## 2013-01-31 LAB — MAGNESIUM: Magnesium: 6.2 mg/dL (ref 1.5–2.5)

## 2013-01-31 LAB — GLUCOSE, CAPILLARY: Glucose-Capillary: 106 mg/dL — ABNORMAL HIGH (ref 70–99)

## 2013-01-31 MED ORDER — OXYCODONE-ACETAMINOPHEN 5-325 MG PO TABS
1.0000 | ORAL_TABLET | ORAL | Status: DC | PRN
  Administered 2013-01-31 (×2): 1 via ORAL
  Filled 2013-01-31 (×2): qty 1

## 2013-01-31 MED ORDER — NICARDIPINE HCL IN NACL 20-0.86 MG/200ML-% IV SOLN
5.0000 mg/h | INTRAVENOUS | Status: DC
  Administered 2013-01-31: 5 mg/h via INTRAVENOUS
  Filled 2013-01-31: qty 200

## 2013-01-31 MED ORDER — LORAZEPAM 2 MG/ML IJ SOLN: 1.0000 mg | Freq: Four times a day (QID) | INTRAMUSCULAR | Status: DC

## 2013-01-31 MED ORDER — LORAZEPAM 2 MG/ML IJ SOLN
1.0000 mg | Freq: Four times a day (QID) | INTRAMUSCULAR | Status: DC | PRN
  Administered 2013-01-31 – 2013-02-04 (×5): 1 mg via INTRAVENOUS
  Filled 2013-01-31 (×5): qty 1

## 2013-01-31 MED ORDER — PROMETHAZINE HCL 25 MG/ML IJ SOLN
25.0000 mg | Freq: Four times a day (QID) | INTRAMUSCULAR | Status: DC | PRN
  Filled 2013-01-31: qty 1

## 2013-01-31 MED ORDER — METOCLOPRAMIDE HCL 5 MG/ML IJ SOLN
10.0000 mg | Freq: Four times a day (QID) | INTRAMUSCULAR | Status: DC | PRN
  Administered 2013-02-01 (×3): 10 mg via INTRAVENOUS
  Filled 2013-01-31 (×3): qty 2

## 2013-01-31 NOTE — Progress Notes (Signed)
Pt SBPs in 160 for an hour. Pt persist being uncomfortable. She vomited small amount. MD notified, will cont to monitor.

## 2013-01-31 NOTE — Progress Notes (Signed)
  Pt states she is allergic to phenergan. When asked about side effects, pt reported pain and burning at injection site from IM dose in past. Refuses IV phenergan.   Dr. Herma Carson notified. Orders given for IV reglan.  Will continue to monitor.

## 2013-01-31 NOTE — Progress Notes (Signed)
Subjective: Patient awake and alert today.  Follows commands.  Complains of being thirsty and hungry.  No further seizures noted.   Objective: Current vital signs: BP 153/66  Pulse 85  Temp(Src) 97.3 F (36.3 C) (Oral)  Resp 19  Ht 5\' 3"  (1.6 m)  Wt 83.5 kg (184 lb 1.4 oz)  BMI 32.62 kg/m2  SpO2 95% Vital signs in last 24 hours: Temp:  [93.5 F (34.2 C)-97.9 F (36.6 C)] 97.3 F (36.3 C) (02/13 0732) Pulse Rate:  [57-85] 85 (02/13 0700) Resp:  [12-21] 19 (02/13 0700) BP: (131-177)/(64-81) 153/66 mmHg (02/13 0700) SpO2:  [93 %-98 %] 95 % (02/13 0700) Weight:  [80.3 kg (177 lb 0.5 oz)-83.5 kg (184 lb 1.4 oz)] 83.5 kg (184 lb 1.4 oz) (02/13 0400)  Intake/Output from previous day: 02/12 0701 - 02/13 0700 In: 952.9 [I.V.:952.9] Out: 2415 [Urine:2415] Intake/Output this shift:   Nutritional status: NPO  Neurologic Exam: Mental Status: Alert, oriented, thought content appropriate.  Speech fluent without evidence of aphasia.  Able to follow 3 step commands without difficulty. Cranial Nerves: II: Discs flat bilaterally; Visual fields grossly normal, pupils equal, round, reactive to light and accommodation III,IV, VI: ptosis not present, extra-ocular motions intact bilaterally V,VII: smile symmetric, facial light touch sensation normal bilaterally VIII: hearing normal bilaterally IX,X: gag reflex present XI: bilateral shoulder shrug XII: midline tongue extension Motor: Right : Upper extremity   5/5    Left:     Upper extremity   5/5  Lower extremity   5/5     Lower extremity   5/5 Tone and bulk:normal tone throughout; no atrophy noted Sensory: Pinprick and light touch intact throughout, bilaterally Deep Tendon Reflexes: 2+ with absent KJ's bilaterally Plantars: Right: downgoing   Left: downgoing   Lab Results: Basic Metabolic Panel:  Recent Labs Lab 01/29/13 1735 01/29/13 2210 01/30/13 0520 01/30/13 1521 01/30/13 2040  NA 138 138 137 133* 133*  K 4.4 4.4 4.1 4.0  4.2  CL 103 100 103 100 101  CO2 19 18* 22 22 19   GLUCOSE 70 90 76 70 71  BUN 9 10 10 12 13   CREATININE 0.99 1.05 1.03 1.02 0.87  CALCIUM 9.7 8.8 8.4 7.8* 7.6*  MG  --   --  5.1* 4.2* 5.6*    Liver Function Tests:  Recent Labs Lab 01/29/13 1735 01/29/13 2210 01/30/13 0520 01/30/13 1521  AST 35 125* 127* 104*  ALT 23 78* 77* 65*  ALKPHOS 247* 223* 201* 183*  BILITOT 0.4 0.2* 0.3 0.3  PROT 6.1 5.3* 5.5* 5.2*  ALBUMIN 2.5* 2.1* 2.1* 2.0*   No results found for this basename: LIPASE, AMYLASE,  in the last 168 hours No results found for this basename: AMMONIA,  in the last 168 hours  CBC:  Recent Labs Lab 01/29/13 1735 01/29/13 2210 01/30/13 0520 01/30/13 2040  WBC 9.0 10.8* 10.3  --   HGB 12.0 11.8* 11.5* 11.6*  HCT 36.7 36.9 35.4* 34.8*  MCV 81.0 83.1 81.0  --   PLT 216 201 199  --     Cardiac Enzymes:  Recent Labs Lab 01/29/13 2210  TROPONINI <0.30    Lipid Panel: No results found for this basename: CHOL, TRIG, HDL, CHOLHDL, VLDL, LDLCALC,  in the last 168 hours  CBG:  Recent Labs Lab 01/29/13 2224  GLUCAP 70    Microbiology: Results for orders placed during the hospital encounter of 01/29/13  MRSA PCR SCREENING     Status: None   Collection Time  01/30/13  3:00 AM      Result Value Range Status   MRSA by PCR NEGATIVE  NEGATIVE Final   Comment:            The GeneXpert MRSA Assay (FDA     approved for NASAL specimens     only), is one component of a     comprehensive MRSA colonization     surveillance program. It is not     intended to diagnose MRSA     infection nor to guide or     monitor treatment for     MRSA infections.    Coagulation Studies: No results found for this basename: LABPROT, INR,  in the last 72 hours  Imaging: Ct Head Wo Contrast  01/30/2013  *RADIOLOGY REPORT*  Clinical Data: Seizure after cesarean section.  CT HEAD WITHOUT CONTRAST  Technique:  Contiguous axial images were obtained from the base of the skull  through the vertex without contrast.  Comparison: 12/25/2009  Findings:  A greater than expected hypodensity in the occipital lobes bilaterally could reflect infarct or posterior reversible encephalopathy (PRES). The latter is favored.  No mass lesion or intracranial hemorrhage observed.  The brain stem, cerebellum, cerebral peduncles, thalami, basal ganglia, ventricular system, and basilar cisterns appear otherwise normal.  IMPRESSION: 1.  Subtle hypodensity in the occipital lobes bilaterally may be manifestation of posterior reversible encephalopathy syndrome (PRES) or less likely occipital lobe infarct.  Correlate with visual fields assessment and consider MRI of the brain for further characterization.  I discussed these findings by telephone with Dr. Shea Evans at 12:55 p.m. on 01/30/2013.   Original Report Authenticated By: Gaylyn Rong, M.D.    Mr Laqueta Jean Wo Contrast  01/30/2013  *RADIOLOGY REPORT*  Clinical Data: Recent childbirth.  Cardiopulmonary arrest.  MRI HEAD WITHOUT AND WITH CONTRAST  Technique:  Multiplanar, multiecho pulse sequences of the brain and surrounding structures were obtained according to standard protocol without and with intravenous contrast  Contrast: 17mL MULTIHANCE GADOBENATE DIMEGLUMINE 529 MG/ML IV SOLN  Comparison: Head CT 01/29/2013.  Findings: Diffusion imaging does not show any acute or subacute infarction.  The brainstem and cerebellum are normal.  There is abnormal edema in the occipital and posterior parietal brain right more than left.  In this setting, this is most consistent with posterior reversible encephalopathy.  No hemorrhage.  No mass effect or shift.  No mass lesion, hydrocephalus or extra-axial collection.  There is mild mucosal inflammation of the paranasal sinuses.  No skull or skull base lesion.  Major vessels are patent at the base of the brain.  After contrast administration, no abnormal enhancement occurs.  Major venous structures are patent.   IMPRESSION: Edema in the occipital lobes and to a lesser extent the parietal lobes bilaterally, right more than left, most consistent with posterior reversible encephalopathy.  None of these areas shows restricted diffusion to suggest actual infarction.  No hemorrhage.   Original Report Authenticated By: Paulina Fusi, M.D.     Medications:  I have reviewed the patient's current medications. Scheduled: . labetalol  200 mg Oral BID  . magnesium  4 g Intravenous Once  . oxymetazoline  1 spray Each Nare BID    Assessment/Plan: Patient with eclampsia and PRES.  No further seizures noted.  BP controlled.  Awake and alert today.  Exam nonfocal.  Recommendations: 1.  Will start to taper off Magnesium today. Patient diuresing well.  Discussed with OB 2.  Continued BP control 2.  Advance  diet as tolerated.      LOS: 2 days   Thana Farr, MD [facility] [phone] 01/31/2013  8:11 AM

## 2013-01-31 NOTE — Progress Notes (Signed)
[facility]Link Physician-Brief Progress Note Patient Name: Hannah Vega DOB: 02-Oct-1966 MRN: [id number]  Date of Service  01/31/2013   HPI/Events of Note   Hypothermic 94.5  eICU Interventions  bair hugger      Emeka Lindner 01/31/2013, 12:17 AM

## 2013-01-31 NOTE — Progress Notes (Addendum)
  Pt SBP >160 for over 1hr. Not responding to 40mg  IV prn hydralazine. Pt very anxious, restless. She states 'I feel like I'm having a panic attack.' Pt verbalizes she  does not know the cause of her anxiety. BP elevated; other VS WNL. Cardene gtt started.   Pt began to vomit. N/V not responding to IV zofran. [facility] notified at 2100. Orders given for IV phenergan and  Q6hr prn ativan for anxiety.   Emotional support provided. Will continue to monitor.  Oletta Cohn RN

## 2013-01-31 NOTE — Progress Notes (Signed)
[facility]Link Physician-Brief Progress Note Patient Name: SAMAH LAPIANA DOB: 1966/01/13 MRN: [id number]  Date of Service  01/31/2013   HPI/Events of Note   Hypertension, currently on Mg gtt, labetalol PO and Hydralazine PRN  eICU Interventions   Cardene gtt started   Intervention Category Major Interventions: Hypertension - evaluation and management  Safal Halderman 01/31/2013, 3:49 PM

## 2013-01-31 NOTE — Progress Notes (Signed)
  Echocardiogram 2D Echocardiogram has been performed.  Hannah Vega 01/31/2013, 3:52 PM

## 2013-01-31 NOTE — Progress Notes (Signed)
PULMONARY  / CRITICAL CARE MEDICINE  Name: Hannah Vega MRN: [id number] DOB: 1966-05-05    ADMISSION DATE:  01/29/2013 CONSULTATION DATE:  2/12  REFERRING MD :  Thad Ranger, Neurology  CHIEF COMPLAINT:  Eclampsia  BRIEF PATIENT DESCRIPTION: 47 y/o female with HTN who delivered twins via C-section on 2/11 developed seizures and eclampsia two hours post op.  Transferred to [facility] on 2/12 and PCCM asked to admit with neurology consulting.  SIGNIFICANT EVENTS / STUDIES:  2/11 c-section 2/11 2230, seizure, hypoxemic, cardiac arrest, CPR x4 minutes 2/11 CT head>> PRES vs. Occipital lobe infarct 2/11 MRI brain>> PRES  LINES / TUBES: Peripheral IV  CULTURES: none  ANTIBIOTICS: none  SUBJECTIVE:  Feels "terrible" VITAL SIGNS: Temp:  [93.5 F (34.2 C)-97.9 F (36.6 C)] 97.3 F (36.3 C) (02/13 0732) Pulse Rate:  [57-85] 85 (02/13 0700) Resp:  [12-21] 19 (02/13 0700) BP: (131-177)/(64-81) 153/66 mmHg (02/13 0700) SpO2:  [93 %-98 %] 95 % (02/13 0700) Weight:  [80.3 kg (177 lb 0.5 oz)-83.5 kg (184 lb 1.4 oz)] 83.5 kg (184 lb 1.4 oz) (02/13 0400) Room air    INTAKE / OUTPUT: Intake/Output     02/12 0701 - 02/13 0700 02/13 0701 - 02/14 0700   P.O.     I.V. (mL/kg) 952.9 (11.4)    Total Intake(mL/kg) 952.9 (11.4)    Urine (mL/kg/hr) 2415 (1.2)    Blood     Total Output 2415     Net -1462.2            PHYSICAL EXAMINATION: Gen: drowsy but easily aroused, no acute distress HEENT: NCAT, PERRL, EOMi, OP clear, neck supple without masses PULM: CTA B CV: RRR, gallop LLSB, no JVD AB: BS+, soft, nontender, no hsm Ext: warm, trace edema, no clubbing, no cyanosis Derm: no rash or skin breakdown Neuro: Drowsy, arouses to voice, A&Ox4, mae, LUE weakness.    LABS:  Recent Labs Lab 01/29/13 1735 01/29/13 2210 01/29/13 2222 01/30/13 0520 01/30/13 1521 01/30/13 2040 01/31/13 0745  HGB 12.0 11.8*  --  11.5*  --  11.6* 11.2*  WBC 9.0 10.8*  --  10.3  --   --  10.7*  PLT 216 201   --  199  --   --  236  NA 138 138  --  137 133* 133* 134*  K 4.4 4.4  --  4.1 4.0 4.2 4.0  CL 103 100  --  103 100 101 99  CO2 19 18*  --  22 22 19 20   GLUCOSE 70 90  --  76 70 71 74  BUN 9 10  --  10 12 13 13   CREATININE 0.99 1.05  --  1.03 1.02 0.87 0.86  CALCIUM 9.7 8.8  --  8.4 7.8* 7.6* 7.0*  MG  --   --   --  5.1* 4.2* 5.6*  --   AST 35 125*  --  127* 104*  --  79*  ALT 23 78*  --  77* 65*  --  61*  ALKPHOS 247* 223*  --  201* 183*  --  186*  BILITOT 0.4 0.2*  --  0.3 0.3  --  0.3  PROT 6.1 5.3*  --  5.5* 5.2*  --  5.6*  ALBUMIN 2.5* 2.1*  --  2.1* 2.0*  --  2.2*  TROPONINI  --  <0.30  --   --   --   --   --   PHART  --   --  7.343*  --   --   --   --   PCO2ART  --   --  35.3  --   --   --   --   PO2ART  --   --  85.0  --   --   --   --     Recent Labs Lab 01/29/13 2224  GLUCAP 70    CXR: none  ASSESSMENT / PLAN:  NEUROLOGIC A:  PRES/Eclampsia NML EEG as of 2/12 P:   -Magnesium gtt, hope to titrate off today  -Hold sedating meds per neurology -Keep SBP < 160 or MAP < 110 with PRN hydralazine -Other care per neurology  -Check Mg level now, hold Mg gtt if level > 8  PULMONARY A: sensation of dyspnea, not hypoxemic, lungs clear; likely related in part to nasal congestion and anxiety P:   -Monitor respiratory status/O2 saturation in ICU -PRN afrin for sinus congestion -Saline sprays for sinus congestion  CARDIOVASCULAR A:  Hypertension Cardiac arrest 2/11 in setting of hypoxemic respiratory failure after seizure; no evidence of cardiac failure on exam 2/12 Eclampsia P:  -Keep SBP < 160 or MAP < 110 with PRN hydralazine. -Continue labetalol 200 mg PO BID. -Echo 2/13.  RENAL A:  No acute issues P:   -Monitor Mg as above  GASTROINTESTINAL A:  No acute issues P:   -Regular diet.  HEMATOLOGIC A:  Mild anemia, no active bleeding P:  -Monitor H/H  INFECTIOUS A:  No acute issues P:   -Monitor fever curve, WBC  ENDOCRINE A:  No acute issues P:    -Trend glucose   TODAY'S SUMMARY: 46 y/o female with eclampsia on 2/11 with seizure and cardiac arrest due to hypoxemic respiratory failure.  2/12 transferred to [facility] stable on Mg gtt neurologically intact and hemodynamically stable.  Plan to continue Mg gtt overnight, PRES/eclampsia management per neurology, check echo 2/13 given cardiac arrest.Could prob go back to [facility].   BABCOCK,PETE 01/31/2013, 8:45 AM  Patient seen and examined, agree with above note.  I dictated the care and orders written for this patient under my direction.  Alyson Reedy, MD [phone]

## 2013-02-01 ENCOUNTER — Inpatient Hospital Stay (HOSPITAL_COMMUNITY): Payer: 59

## 2013-02-01 LAB — BASIC METABOLIC PANEL
CO2: 23 mEq/L (ref 19–32)
Chloride: 98 mEq/L (ref 96–112)
Potassium: 3.6 mEq/L (ref 3.5–5.1)
Sodium: 134 mEq/L — ABNORMAL LOW (ref 135–145)

## 2013-02-01 LAB — MAGNESIUM: Magnesium: 4.8 mg/dL — ABNORMAL HIGH (ref 1.5–2.5)

## 2013-02-01 MED ORDER — PANTOPRAZOLE SODIUM 40 MG IV SOLR
40.0000 mg | INTRAVENOUS | Status: DC
  Administered 2013-02-01 – 2013-02-02 (×2): 40 mg via INTRAVENOUS
  Filled 2013-02-01: qty 40

## 2013-02-01 MED ORDER — AMLODIPINE BESYLATE 5 MG PO TABS
5.0000 mg | ORAL_TABLET | Freq: Every day | ORAL | Status: DC
  Administered 2013-02-01 – 2013-02-02 (×2): 5 mg via ORAL
  Filled 2013-02-01 (×3): qty 1

## 2013-02-01 MED ORDER — LABETALOL HCL 5 MG/ML IV SOLN: 20.0000 mg | INTRAVENOUS | Status: DC | PRN

## 2013-02-01 NOTE — Progress Notes (Signed)
Subjective: Patient complains of nausea today.  No further seizures noted.  Urine output maintained.  Renal function good.  LFT's slightly improved.   Objective: Current vital signs: BP 166/65  Pulse 85  Temp(Src) 98.1 F (36.7 C) (Oral)  Resp 13  Ht 5\' 3"  (1.6 m)  Wt 83.5 kg (184 lb 1.4 oz)  BMI 32.62 kg/m2  SpO2 95% Vital signs in last 24 hours: Temp:  [97.7 F (36.5 C)-98.9 F (37.2 C)] 98.1 F (36.7 C) (02/14 0400) Pulse Rate:  [68-99] 85 (02/14 0900) Resp:  [13-22] 13 (02/14 0900) BP: (116-171)/(55-94) 166/65 mmHg (02/14 0900) SpO2:  [91 %-97 %] 95 % (02/14 0900)  Intake/Output from previous day: 02/13 0701 - 02/14 0700 In: 3401.2 [P.O.:240; I.V.:3161.2] Out: 3085 [Urine:3085] Intake/Output this shift: Total I/O In: 100 [I.V.:100] Out: -  Nutritional status: General  Neurologic Exam: Mental Status:  Alert, oriented, thought content appropriate. Speech fluent without evidence of aphasia. Able to follow 3 step commands without difficulty.  Cranial Nerves:  II: Discs flat bilaterally; Visual fields grossly normal, pupils equal, round, reactive to light and accommodation  III,IV, VI: ptosis not present, extra-ocular motions intact bilaterally  V,VII: smile symmetric, facial light touch sensation normal bilaterally  VIII: hearing normal bilaterally  IX,X: gag reflex present  XI: bilateral shoulder shrug  XII: midline tongue extension  Motor:  Right : Upper extremity 5/5         Left: Upper extremity 5/5   Lower extremity 5/5      Lower extremity 5/5  Tone and bulk:normal tone throughout; no atrophy noted  Sensory: Pinprick and light touch intact throughout, bilaterally  Deep Tendon Reflexes: 2+ with absent KJ's bilaterally  Plantars:  Right: downgoing     Left: downgoing   Lab Results: Basic Metabolic Panel:  Recent Labs Lab 01/30/13 0520 01/30/13 1521 01/30/13 2040 01/31/13 0745 02/01/13 0435  NA 137 133* 133* 134* 134*  K 4.1 4.0 4.2 4.0 3.6  CL  103 100 101 99 98  CO2 22 22 19 20 23   GLUCOSE 76 70 71 74 122*  BUN 10 12 13 13 13   CREATININE 1.03 1.02 0.87 0.86 0.83  CALCIUM 8.4 7.8* 7.6* 7.0* 6.8*  MG 5.1* 4.2* 5.6* 6.2* 4.8*    Liver Function Tests:  Recent Labs Lab 01/29/13 1735 01/29/13 2210 01/30/13 0520 01/30/13 1521 01/31/13 0745  AST 35 125* 127* 104* 79*  ALT 23 78* 77* 65* 61*  ALKPHOS 247* 223* 201* 183* 186*  BILITOT 0.4 0.2* 0.3 0.3 0.3  PROT 6.1 5.3* 5.5* 5.2* 5.6*  ALBUMIN 2.5* 2.1* 2.1* 2.0* 2.2*   No results found for this basename: LIPASE, AMYLASE,  in the last 168 hours No results found for this basename: AMMONIA,  in the last 168 hours  CBC:  Recent Labs Lab 01/29/13 1735 01/29/13 2210 01/30/13 0520 01/30/13 2040 01/31/13 0745  WBC 9.0 10.8* 10.3  --  10.7*  HGB 12.0 11.8* 11.5* 11.6* 11.2*  HCT 36.7 36.9 35.4* 34.8* 33.6*  MCV 81.0 83.1 81.0  --  80.2  PLT 216 201 199  --  236    Cardiac Enzymes:  Recent Labs Lab 01/29/13 2210  TROPONINI <0.30    Lipid Panel: No results found for this basename: CHOL, TRIG, HDL, CHOLHDL, VLDL, LDLCALC,  in the last 168 hours  CBG:  Recent Labs Lab 01/29/13 2224 01/31/13 1135 01/31/13 1723  GLUCAP 70 91 106*    Microbiology: Results for orders placed during the hospital encounter  of 01/29/13  MRSA PCR SCREENING     Status: None   Collection Time    01/30/13  3:00 AM      Result Value Range Status   MRSA by PCR NEGATIVE  NEGATIVE Final   Comment:            The GeneXpert MRSA Assay (FDA     approved for NASAL specimens     only), is one component of a     comprehensive MRSA colonization     surveillance program. It is not     intended to diagnose MRSA     infection nor to guide or     monitor treatment for     MRSA infections.    Coagulation Studies: No results found for this basename: LABPROT, INR,  in the last 72 hours  Imaging: Mr Laqueta Jean Wo Contrast  01/30/2013  *RADIOLOGY REPORT*  Clinical Data: Recent childbirth.   Cardiopulmonary arrest.  MRI HEAD WITHOUT AND WITH CONTRAST  Technique:  Multiplanar, multiecho pulse sequences of the brain and surrounding structures were obtained according to standard protocol without and with intravenous contrast  Contrast: 17mL MULTIHANCE GADOBENATE DIMEGLUMINE 529 MG/ML IV SOLN  Comparison: Head CT 01/29/2013.  Findings: Diffusion imaging does not show any acute or subacute infarction.  The brainstem and cerebellum are normal.  There is abnormal edema in the occipital and posterior parietal brain right more than left.  In this setting, this is most consistent with posterior reversible encephalopathy.  No hemorrhage.  No mass effect or shift.  No mass lesion, hydrocephalus or extra-axial collection.  There is mild mucosal inflammation of the paranasal sinuses.  No skull or skull base lesion.  Major vessels are patent at the base of the brain.  After contrast administration, no abnormal enhancement occurs.  Major venous structures are patent.  IMPRESSION: Edema in the occipital lobes and to a lesser extent the parietal lobes bilaterally, right more than left, most consistent with posterior reversible encephalopathy.  None of these areas shows restricted diffusion to suggest actual infarction.  No hemorrhage.   Original Report Authenticated By: Paulina Fusi, M.D.     Medications:  I have reviewed the patient's current medications. Scheduled: . labetalol  200 mg Oral BID  . magnesium  4 g Intravenous Once  . oxymetazoline  1 spray Each Nare BID  . pantoprazole (PROTONIX) IV  40 mg Intravenous Q24H    Assessment/Plan: No further seizures noted.  Exam nonfocal.    Recommendations: 1.  D/C Magnesium today. 2.  Continue seizure precautions 3.  Continue BP control.   4.  Protonix for nausea   LOS: 3 days   Thana Farr, MD [facility] [phone] 02/01/2013  9:30 AM

## 2013-02-01 NOTE — Progress Notes (Signed)
PULMONARY  / CRITICAL CARE MEDICINE  Name: Hannah Vega MRN: [id number] DOB: Apr 26, 1966    ADMISSION DATE:  01/29/2013 CONSULTATION DATE:  2/12  REFERRING MD :  Thad Ranger, Neurology  CHIEF COMPLAINT:  Eclampsia  BRIEF PATIENT DESCRIPTION: 47 y/o female with HTN who delivered twins via C-section on 2/11 developed seizures and eclampsia two hours post op.  Transferred to [facility] on 2/12 and PCCM asked to admit with neurology consulting.  SIGNIFICANT EVENTS / STUDIES:  2/11 c-section 2/11 2230, seizure, hypoxemic, cardiac arrest, CPR x4 minutes 2/11 CT head>> PRES vs. Occipital lobe infarct 2/11 MRI brain>> PRES 2/13 2 D echo >>EF  60% to 65%.  LA  mildly to moderately dilated. - Pericardium, extracardiac: A moderate to large pericardial effusion was identified circumferential to the heart.There was no evidence of hemodynamic compromise.   LINES / TUBES: Peripheral IV  CULTURES: none  ANTIBIOTICS: none  SUBJECTIVE:  +n/v , unable to keep anything down for long despite reglan and zofran  B/p up and down , cardene drip started last pm, weaned off due to low b/p  No seizure activity ,  Mg drip d/c per neuro       VITAL SIGNS: Temp:  [97.7 F (36.5 C)-98.9 F (37.2 C)] 98.1 F (36.7 C) (02/14 0400) Pulse Rate:  [68-99] 85 (02/14 0900) Resp:  [13-22] 13 (02/14 0900) BP: (116-171)/(55-94) 166/65 mmHg (02/14 0900) SpO2:  [91 %-97 %] 95 % (02/14 0900) Room air    INTAKE / OUTPUT: Intake/Output     02/13 0701 - 02/14 0700 02/14 0701 - 02/15 0700   P.O. 240    I.V. (mL/kg) 3161.2 (37.9) 100 (1.2)   Total Intake(mL/kg) 3401.2 (40.7) 100 (1.2)   Urine (mL/kg/hr) 3085 (1.5)    Total Output 3085     Net +316.2 +100        Emesis Occurrence 5 x      PHYSICAL EXAMINATION: Gen: appears uncomfortable, no  acute distress HEENT: NCAT, PERRL, EOMi, OP clear, neck supple without masses PULM: CTA B CV: RRR,  no JVD AB: BS diminshed , soft mild distention , gen tenderness , no  hsm Ext: warm, trace edema, no clubbing, no cyanosis Derm: no rash or skin breakdown Neuro:  A&Ox4, mae,   LABS:  Recent Labs Lab 01/29/13 1735 01/29/13 2210 01/29/13 2222  01/30/13 0520 01/30/13 1521 01/30/13 2040 01/31/13 0745 02/01/13 0435  HGB 12.0 11.8*  --   --  11.5*  --  11.6* 11.2*  --   WBC 9.0 10.8*  --   --  10.3  --   --  10.7*  --   PLT 216 201  --   --  199  --   --  236  --   NA 138 138  --   --  137 133* 133* 134* 134*  K 4.4 4.4  --   --  4.1 4.0 4.2 4.0 3.6  CL 103 100  --   --  103 100 101 99 98  CO2 19 18*  --   --  22 22 19 20 23   GLUCOSE 70 90  --   --  76 70 71 74 122*  BUN 9 10  --   --  10 12 13 13 13   CREATININE 0.99 1.05  --   --  1.03 1.02 0.87 0.86 0.83  CALCIUM 9.7 8.8  --   --  8.4 7.8* 7.6* 7.0* 6.8*  MG  --   --   --   < >  5.1* 4.2* 5.6* 6.2* 4.8*  AST 35 125*  --   --  127* 104*  --  79*  --   ALT 23 78*  --   --  77* 65*  --  61*  --   ALKPHOS 247* 223*  --   --  201* 183*  --  186*  --   BILITOT 0.4 0.2*  --   --  0.3 0.3  --  0.3  --   PROT 6.1 5.3*  --   --  5.5* 5.2*  --  5.6*  --   ALBUMIN 2.5* 2.1*  --   --  2.1* 2.0*  --  2.2*  --   TROPONINI  --  <0.30  --   --   --   --   --   --   --   PHART  --   --  7.343*  --   --   --   --   --   --   PCO2ART  --   --  35.3  --   --   --   --   --   --   PO2ART  --   --  85.0  --   --   --   --   --   --   < > = values in this interval not displayed.  Recent Labs Lab 01/29/13 2224 01/31/13 1135 01/31/13 1723  GLUCAP 70 91 106*    CXR: none  ASSESSMENT / PLAN:  NEUROLOGIC A:  PRES/Eclampsia NML EEG as of 2/12 P:   -Magnesium gtt titrated off 2/14 am  -Keep SBP < 160 or MAP < 110 with PRN hydralazine -Other care per neurology  -Check Mg level in am   PULMONARY A:   P:   -Monitor respiratory status/O2 saturation in ICU -PRN afrin for sinus congestion -Saline sprays for sinus congestion  CARDIOVASCULAR A:  Hypertension Cardiac arrest 2/11 in setting of hypoxemic  respiratory failure after seizure;  Eclampsia 2D echo showed  P:  -Keep SBP < 160 or MAP < 110 with PRN hydralazine. -Continue labetalol 200 mg PO BID. -Echo 2/13.mod /lg pericardial effusion, EF 60%, mild/mod RA dilatation   RENAL A:  No acute issues P:   -Monitor Mg as above  GASTROINTESTINAL A:  N/V ? Related to Mg drip  P:   -Regular diet. -cont reglan/zofran  -check KUB   HEMATOLOGIC A:  Mild anemia, no active bleeding P:  -Monitor H/H  INFECTIOUS A:  No acute issues P:   -Monitor fever curve, WBC  ENDOCRINE A:  No acute issues P:   -Trend glucose   TODAY'S SUMMARY: 47 y/o female with eclampsia on 2/11 with seizure and cardiac arrest due to hypoxemic respiratory failure.  2/12 transferred to [facility] stable on Mg gtt neurologically intact and hemodynamically stable.  Plan to continue Mg gtt overnight, PRES/eclampsia management per neurology, check echo 2/13 given cardiac arrest.   PARRETT,TAMMY 02/01/2013, 11:05 AM  Start PO norvasc, PRN labetalol, d/c cardene drip, CXR portable for hypoxemia and titrate o2.  Patient seen and examined, agree with above note.  I dictated the care and orders written for this patient under my direction.  Alyson Reedy, M.D. [facility] Pulmonary/Critical Care Medicine. Pager: [phone]. After hours pager: [phone].

## 2013-02-02 ENCOUNTER — Inpatient Hospital Stay (HOSPITAL_COMMUNITY): Payer: 59

## 2013-02-02 DIAGNOSIS — G9349 Other encephalopathy: Secondary | ICD-10-CM

## 2013-02-02 DIAGNOSIS — I10 Essential (primary) hypertension: Secondary | ICD-10-CM

## 2013-02-02 DIAGNOSIS — O151 Eclampsia in labor: Secondary | ICD-10-CM

## 2013-02-02 LAB — COMPREHENSIVE METABOLIC PANEL
ALT: 46 U/L — ABNORMAL HIGH (ref 0–35)
CO2: 25 mEq/L (ref 19–32)
Calcium: 7.9 mg/dL — ABNORMAL LOW (ref 8.4–10.5)
Chloride: 105 mEq/L (ref 96–112)
GFR calc Af Amer: >90 mL/min (ref >90)
GFR calc non Af Amer: 79 mL/min — ABNORMAL LOW (ref >90)
Glucose, Bld: 90 mg/dL (ref 70–99)
Sodium: 139 mEq/L (ref 135–145)
Total Bilirubin: 0.3 mg/dL (ref 0.3–1.2)

## 2013-02-02 LAB — CBC
Hemoglobin: 11.1 g/dL — ABNORMAL LOW (ref 12.0–15.0)
MCH: 26.1 pg (ref 26.0–34.0)
MCV: 81.7 fL (ref 78.0–100.0)
RBC: 4.26 MIL/uL (ref 3.87–5.11)
WBC: 9.2 10*3/uL (ref 4.0–10.5)

## 2013-02-02 MED ORDER — FUROSEMIDE 10 MG/ML IJ SOLN
40.0000 mg | Freq: Four times a day (QID) | INTRAMUSCULAR | Status: AC
  Administered 2013-02-02: 40 mg via INTRAVENOUS
  Filled 2013-02-02 (×2): qty 4

## 2013-02-02 MED ORDER — LACTATED RINGERS IV SOLN: INTRAVENOUS | Status: DC | PRN

## 2013-02-02 MED ORDER — POTASSIUM CHLORIDE CRYS ER 20 MEQ PO TBCR
40.0000 meq | EXTENDED_RELEASE_TABLET | Freq: Once | ORAL | Status: AC
  Administered 2013-02-02: 40 meq via ORAL
  Filled 2013-02-02: qty 2

## 2013-02-02 MED ORDER — FLUTICASONE PROPIONATE 50 MCG/ACT NA SUSP
2.0000 | Freq: Every day | NASAL | Status: DC
  Administered 2013-02-02 – 2013-02-04 (×3): 2 via NASAL
  Filled 2013-02-02: qty 16

## 2013-02-02 MED ORDER — LABETALOL HCL 5 MG/ML IV SOLN: 20.0000 mg | INTRAVENOUS | Status: DC | PRN

## 2013-02-02 MED ORDER — SALINE SPRAY 0.65 % NA SOLN: 1.0000 | NASAL | Status: DC | PRN

## 2013-02-02 NOTE — Progress Notes (Signed)
Patient ID: Hannah Vega, female   DOB: 1966-08-06, 47 y.o.   MRN: [id number] Subjective: Feels better, no confusion. Wants to return to [facility] to be with babies but agrees to wait for medical clearance. No HAs/ visual floaters/ RUQ pain.  Post op pain well controlled, feels distended but passing flatus. Appetite better and n/v resolved since magnesium was stopped. Lochia minimal.   Objective: Vital signs in last 24 hours: Temp:  [98 F (36.7 C)-98.6 F (37 C)] 98 F (36.7 C) (02/15 1211) Pulse Rate:  [64-82] 70 (02/15 1500) Resp:  [11-18] 14 (02/15 1500) BP: (124-172)/(60-96) 127/66 mmHg (02/15 1500) SpO2:  [91 %-99 %] 96 % (02/15 1500)  Intake/Output from previous day: 02/14 0701 - 02/15 0700 In: 1650 [P.O.:840; I.V.:810] Out: 2950 [Urine:2950] Intake/Output this shift: Total I/O In: 600 [P.O.:600] Out: 4460 [Urine:4460]  Physical exam: limited exam A&O x 3, no acute distress. Abdo soft, non tender, non acute. Uterus firm 2 cm below umbilicus. Incision clean and dry, staples removed, steri-strips applied Extr no edema/ tenderness, no clinical DVT  Lab Results: Hgb low, platelets stable (normal), creatinine normal. AST/ALT trending down.   Assessment/Plan:  LOS: 4 days  47 yo with chronic HTN, twins c/section on 2/11 for fetal IUGR. Postpartum Eclampsia, PRES, uncontrolled HTN, transferred to [facility] ICU on 01/30/13. Is receiving Lasix today for pulm/pericardial effusion.  1) HTN- much better control, on Amlodipine as well, has PCP, will see him PP to manage HTN. Echo noted normal EF, ruled out PPcardiomyopathy or acute cardiac injury 2) Eclampsia- s/p magnesium for >48 hrs, no toxicity. Stable at present, all labs trending back to normal and needs f/up CBC, CMP outpatient in 1 wk.  3) PRES- confusion resolved, no deficits. Neuro to advise on post discharge f/up 4) Post-op - good. Staples removed. Can be discharged home from c/section standpoint.  5) Postpartum- no depression, but  anxious to see babies (NICU).   Will arrange for "rooming in" at [facility] unless Critical care team feels she needs to be monitored in house at [facility] hosp.   Estuardo Frisbee R 02/02/2013, 3:59 PM

## 2013-02-02 NOTE — Progress Notes (Addendum)
Subjective: Per report of husband patient clearer cognitively.  No further complaints of nausea.  Awake and alert.  No further seizures noted.  Objective: Current vital signs: BP 162/68  Pulse 70  Temp(Src) 98.6 F (37 C) (Oral)  Resp 15  Ht 5\' 3"  (1.6 m)  Wt 83.5 kg (184 lb 1.4 oz)  BMI 32.62 kg/m2  SpO2 99%  Breastfeeding? No Vital signs in last 24 hours: Temp:  [97.6 F (36.4 C)-98.6 F (37 C)] 98.6 F (37 C) (02/15 0404) Pulse Rate:  [64-88] 70 (02/15 0735) Resp:  [11-18] 15 (02/15 0735) BP: (124-172)/(59-96) 162/68 mmHg (02/15 0735) SpO2:  [91 %-99 %] 99 % (02/15 0735)  Intake/Output from previous day: 02/14 0701 - 02/15 0700 In: 1650 [P.O.:840; I.V.:810] Out: 2950 [Urine:2950] Intake/Output this shift:   Nutritional status: General  Neurologic Exam: Mental Status:  Alert, oriented, thought content appropriate. Speech fluent without evidence of aphasia. Able to follow 3 step commands without difficulty.  Cranial Nerves:  II: Discs flat bilaterally; Visual fields grossly normal, pupils equal, round, reactive to light and accommodation  III,IV, VI: ptosis not present, extra-ocular motions intact bilaterally  V,VII: smile symmetric, facial light touch sensation normal bilaterally  VIII: hearing normal bilaterally  IX,X: gag reflex present  XI: bilateral shoulder shrug  XII: midline tongue extension  Motor:  Right : Upper extremity 5/5          Left: Upper extremity 5/5   Lower extremity 5/5       Lower extremity 5/5  Tone and bulk:normal tone throughout; no atrophy noted  Sensory: Pinprick and light touch intact throughout, bilaterally  Deep Tendon Reflexes: 2+ with absent KJ's bilaterally  Plantars:  Right: downgoing       Left: downgoing      Lab Results: Basic Metabolic Panel:  Recent Labs Lab 01/30/13 1521 01/30/13 2040 01/31/13 0745 02/01/13 0435 02/02/13 0600  NA 133* 133* 134* 134* 139  K 4.0 4.2 4.0 3.6 3.8  CL 100 101 99 98 105  CO2 22  19 20 23 25   GLUCOSE 70 71 74 122* 90  BUN 12 13 13 13 12   CREATININE 1.02 0.87 0.86 0.83 0.86  CALCIUM 7.8* 7.6* 7.0* 6.8* 7.9*  MG 4.2* 5.6* 6.2* 4.8* 2.6*  PHOS  --   --   --   --  3.2    Liver Function Tests:  Recent Labs Lab 01/29/13 2210 01/30/13 0520 01/30/13 1521 01/31/13 0745 02/02/13 0600  AST 125* 127* 104* 79* 51*  ALT 78* 77* 65* 61* 46*  ALKPHOS 223* 201* 183* 186* 150*  BILITOT 0.2* 0.3 0.3 0.3 0.3  PROT 5.3* 5.5* 5.2* 5.6* 5.5*  ALBUMIN 2.1* 2.1* 2.0* 2.2* 2.1*   No results found for this basename: LIPASE, AMYLASE,  in the last 168 hours No results found for this basename: AMMONIA,  in the last 168 hours  CBC:  Recent Labs Lab 01/29/13 1735 01/29/13 2210 01/30/13 0520 01/30/13 2040 01/31/13 0745 02/02/13 0600  WBC 9.0 10.8* 10.3  --  10.7* 9.2  HGB 12.0 11.8* 11.5* 11.6* 11.2* 11.1*  HCT 36.7 36.9 35.4* 34.8* 33.6* 34.8*  MCV 81.0 83.1 81.0  --  80.2 81.7  PLT 216 201 199  --  236 255    Cardiac Enzymes:  Recent Labs Lab 01/29/13 2210  TROPONINI <0.30    Lipid Panel: No results found for this basename: CHOL, TRIG, HDL, CHOLHDL, VLDL, LDLCALC,  in the last 168 hours  CBG:  Recent  Labs Lab 01/29/13 2224 01/31/13 0902 01/31/13 1135 01/31/13 1723  GLUCAP 70 75 91 106*    Microbiology: Results for orders placed during the hospital encounter of 01/29/13  MRSA PCR SCREENING     Status: None   Collection Time    01/30/13  3:00 AM      Result Value Range Status   MRSA by PCR NEGATIVE  NEGATIVE Final   Comment:            The GeneXpert MRSA Assay (FDA     approved for NASAL specimens     only), is one component of a     comprehensive MRSA colonization     surveillance program. It is not     intended to diagnose MRSA     infection nor to guide or     monitor treatment for     MRSA infections.    Coagulation Studies: No results found for this basename: LABPROT, INR,  in the last 72 hours  Imaging: Dg Chest Port 1  View  02/02/2013  *RADIOLOGY REPORT*  Clinical Data: Hypertension, vomiting and nausea.  PORTABLE CHEST - 1 VIEW  Comparison: Chest x-ray 02/01/2013.  Findings: Lung volumes are normal.  There is some bibasilar opacities favored to reflects small bilateral pleural effusions with associated subsegmental atelectasis in the lung bases (underlying air space consolidation is difficult to entirely excluded, particularly in the left lower lobe).  Pulmonary venous congestion, with mild indistinctness of the interstitial markings and some Kerley B lines in the periphery of the lungs, compatible with mild interstitial pulmonary edema.  Heart size is borderline to mildly enlarged.  Upper mediastinal contours are unremarkable.  IMPRESSION: 1.  The appearance of the chest suggests mild congestive heart failure, as detailed above.   Original Report Authenticated By: Trudie Reed, M.D.    Dg Chest Port 1 View  02/01/2013  *RADIOLOGY REPORT*  Clinical Data: Hypoxemia.  Weakness.  Recent cesarean section.  PORTABLE CHEST - 1 VIEW  Comparison: 12/25/2009.  Findings: Cardiac silhouette is borderline size accentuated by AP magnification.  No mediastinal or hilar lesions are seen.  There appear to be bilateral pleural effusions with associated basilar pulmonary infiltrative densities and atelectasis. No consolidation evident.  No skeletal lesions are seen.  IMPRESSION: Bilateral pleural effusions with associated basilar atelectasis and pulmonary infiltrative densities. No consolidation evident.   Original Report Authenticated By: Onalee Hua Call    Dg Abd Portable 1v  02/01/2013  *RADIOLOGY REPORT*  Clinical Data: Hypoxemia.  Weakness.  Nausea and vomiting.  Post cesarean section 3 days previously.  PORTABLE ABDOMEN - 1 VIEW  Comparison: None.  Findings: History given that the patient undergone recent cesarean section. Bowel gas distribution is consistent with this.  Surgical skin staples are seen over the lower pelvis.  There is  fecal distention of portions of the colon.  There is gaseous distention of stomach.  No opaque calculi are seen.  No skeletal lesions are evident.  IMPRESSION: Bowel gas configuration consistent with the recent pregnancy. Gaseous distention of stomach.  No significant small-bowel dilatation.  Fecal distention of portions of the colon.   Original Report Authenticated By: Onalee Hua Call     Medications:  I have reviewed the patient's current medications. Scheduled: . amLODipine  5 mg Oral Daily  . labetalol  200 mg Oral BID  . magnesium  4 g Intravenous Once  . oxymetazoline  1 spray Each Nare BID  . pantoprazole (PROTONIX) IV  40 mg Intravenous Q24H  Assessment/Plan: Patient improving.  No further seizures noted.  LFT's continue to trend back to normal.  Mg level decreasing.  Has been out of bed ambulating with therapy.  BP's slightly higher than optimal.  Recommendations: 1.  Improved BP control 2.  OOB as tolerated  Case discussed with CCM    LOS: 4 days   Thana Farr, MD [facility] [phone] 02/02/2013  8:22 AM

## 2013-02-02 NOTE — Progress Notes (Signed)
PULMONARY  / CRITICAL CARE MEDICINE  Name: Hannah Vega MRN: [id number] DOB: [birth year]    ADMISSION DATE:  01/29/2013 CONSULTATION DATE:  2/12  REFERRING MD :  Thad Ranger, Neurology  CHIEF COMPLAINT:  Eclampsia  BRIEF PATIENT DESCRIPTION: 47 y/o female with HTN who delivered twins via C-section on 2/11 developed seizures and eclampsia two hours post op.  Transferred to [facility] on 2/12 and PCCM asked to admit with neurology consulting.  SIGNIFICANT EVENTS / STUDIES:  2/11 c-section 2/11 2230, seizure, hypoxemic, cardiac arrest, CPR x4 minutes 2/11 CT head>> PRES vs. Occipital lobe infarct 2/11 MRI brain>> PRES 2/13 2 D echo >> EF 60 to 65%, mild/mod LA dilation, mod/large pericardial effusion, no tamponade.  LINES / TUBES: Peripheral IV  SUBJECTIVE:  Denies nausea, chest pain.  Breathing better.  VITAL SIGNS: Temp:  [97.6 F (36.4 C)-98.6 F (37 C)] 98.6 F (37 C) (02/15 0404) Pulse Rate:  [64-82] 82 (02/15 1000) Resp:  [11-18] 14 (02/15 1000) BP: (124-172)/(59-96) 153/64 mmHg (02/15 1000) SpO2:  [91 %-99 %] 96 % (02/15 1000)    INTAKE / OUTPUT: Intake/Output     02/14 0701 - 02/15 0700 02/15 0701 - 02/16 0700   P.O. 840 240   I.V. (mL/kg) 810 (9.7)    Total Intake(mL/kg) 1650 (19.8) 240 (2.9)   Urine (mL/kg/hr) 2950 (1.5) 910 (2.3)   Total Output 2950 910   Net -1300 -670        Emesis Occurrence 3 x      PHYSICAL EXAMINATION: Gen: No distress HEENT: Clear nasal discharge PULM: basilar rales CV: regular, no murmur AB: soft, non tender Ext: 1+ edema Derm: No rashes Neuro:  Alert, normal strength   LABS:  Recent Labs Lab 01/29/13 1735 01/29/13 2210 01/29/13 2222  01/30/13 0520 01/30/13 1521 01/30/13 2040 01/31/13 0745 02/01/13 0435 02/02/13 0600  HGB 12.0 11.8*  --   --  11.5*  --  11.6* 11.2*  --  11.1*  WBC 9.0 10.8*  --   --  10.3  --   --  10.7*  --  9.2  PLT 216 201  --   --  199  --   --  236  --  255  NA 138 138  --   --  137 133* 133* 134*  134* 139  K 4.4 4.4  --   --  4.1 4.0 4.2 4.0 3.6 3.8  CL 103 100  --   --  103 100 101 99 98 105  CO2 19 18*  --   --  22 22 19 20 23 25   GLUCOSE 70 90  --   --  76 70 71 74 122* 90  BUN 9 10  --   --  10 12 13 13 13 12   CREATININE 0.99 1.05  --   --  1.03 1.02 0.87 0.86 0.83 0.86  CALCIUM 9.7 8.8  --   --  8.4 7.8* 7.6* 7.0* 6.8* 7.9*  MG  --   --   --   < > 5.1* 4.2* 5.6* 6.2* 4.8* 2.6*  PHOS  --   --   --   --   --   --   --   --   --  3.2  AST 35 125*  --   --  127* 104*  --  79*  --  51*  ALT 23 78*  --   --  77* 65*  --  61*  --  46*  ALKPHOS 247* 223*  --   --  201* 183*  --  186*  --  150*  BILITOT 0.4 0.2*  --   --  0.3 0.3  --  0.3  --  0.3  PROT 6.1 5.3*  --   --  5.5* 5.2*  --  5.6*  --  5.5*  ALBUMIN 2.5* 2.1*  --   --  2.1* 2.0*  --  2.2*  --  2.1*  TROPONINI  --  <0.30  --   --   --   --   --   --   --   --   PHART  --   --  7.343*  --   --   --   --   --   --   --   PCO2ART  --   --  35.3  --   --   --   --   --   --   --   PO2ART  --   --  85.0  --   --   --   --   --   --   --   < > = values in this interval not displayed.  Recent Labs Lab 01/29/13 2224 01/31/13 0902 01/31/13 1135 01/31/13 1723  GLUCAP 70 75 91 106*    Imaging: Dg Chest Port 1 View  02/02/2013  *RADIOLOGY REPORT*  Clinical Data: Hypertension, vomiting and nausea.  PORTABLE CHEST - 1 VIEW  Comparison: Chest x-ray 02/01/2013.  Findings: Lung volumes are normal.  There is some bibasilar opacities favored to reflects small bilateral pleural effusions with associated subsegmental atelectasis in the lung bases (underlying air space consolidation is difficult to entirely excluded, particularly in the left lower lobe).  Pulmonary venous congestion, with mild indistinctness of the interstitial markings and some Kerley B lines in the periphery of the lungs, compatible with mild interstitial pulmonary edema.  Heart size is borderline to mildly enlarged.  Upper mediastinal contours are unremarkable.   IMPRESSION: 1.  The appearance of the chest suggests mild congestive heart failure, as detailed above.   Original Report Authenticated By: Trudie Reed, M.D.    Dg Chest Port 1 View  02/01/2013  *RADIOLOGY REPORT*  Clinical Data: Hypoxemia.  Weakness.  Recent cesarean section.  PORTABLE CHEST - 1 VIEW  Comparison: 12/25/2009.  Findings: Cardiac silhouette is borderline size accentuated by AP magnification.  No mediastinal or hilar lesions are seen.  There appear to be bilateral pleural effusions with associated basilar pulmonary infiltrative densities and atelectasis. No consolidation evident.  No skeletal lesions are seen.  IMPRESSION: Bilateral pleural effusions with associated basilar atelectasis and pulmonary infiltrative densities. No consolidation evident.   Original Report Authenticated By: Onalee Hua Call    Dg Abd Portable 1v  02/01/2013  *RADIOLOGY REPORT*  Clinical Data: Hypoxemia.  Weakness.  Nausea and vomiting.  Post cesarean section 3 days previously.  PORTABLE ABDOMEN - 1 VIEW  Comparison: None.  Findings: History given that the patient undergone recent cesarean section. Bowel gas distribution is consistent with this.  Surgical skin staples are seen over the lower pelvis.  There is fecal distention of portions of the colon.  There is gaseous distention of stomach.  No opaque calculi are seen.  No skeletal lesions are evident.  IMPRESSION: Bowel gas configuration consistent with the recent pregnancy. Gaseous distention of stomach.  No significant small-bowel dilatation.  Fecal distention of portions of the colon.   Original Report Authenticated By: Onalee Hua Call  ASSESSMENT / PLAN:  NEUROLOGIC A:  PRES/Eclampsia Normal EEG as of 2/12.  Off magnesium 2/14. P:   Monitor neurologic status  CARDIOVASCULAR A: Eclampsia. Hypertension. Pericardial effusion on Echo 2/13 >> no evidence for tamponade. P:  Labetalol 200 mg bid, amlodipine 5 mg qd PRN labetalol, hydralazine for goal SBP <  160 Will need repeat Echo in several days Monitor hemodynamics  RENAL A: Hypervolemia. P:   Lasix 40 mg IV q6h x 2 doses 2/15 Keep foley in for now  PULMONARY A: Rhinitis. P: Avoid afrin due to concern for affect on blood pressure Flonase, ocean spray  GASTROINTESTINAL A: Nutrition. P:   Regular diet.  OBSTETRICS A: s/p C-section. P: Post-op care per OB/GYN  Resolved problems >> Cardiac arrest 2nd to hypoxia/seizure, nausea/vomiting, acute hypoxic respiratory distress, anemia   Updated family at bedside.  If stable, may try to arrange for transfer back to [facility] 2/16 so she can be with her twin girls.  Coralyn Helling, MD [facility] Pulmonary/Critical Care 02/02/2013, 11:46 AM Pager:  [phone] After 3pm call: [phone]

## 2013-02-03 ENCOUNTER — Inpatient Hospital Stay (HOSPITAL_COMMUNITY): Payer: 59

## 2013-02-03 DIAGNOSIS — I313 Pericardial effusion (noninflammatory): Secondary | ICD-10-CM | POA: Diagnosis not present

## 2013-02-03 DIAGNOSIS — I3139 Other pericardial effusion (noninflammatory): Secondary | ICD-10-CM | POA: Diagnosis not present

## 2013-02-03 DIAGNOSIS — I319 Disease of pericardium, unspecified: Secondary | ICD-10-CM

## 2013-02-03 DIAGNOSIS — R569 Unspecified convulsions: Secondary | ICD-10-CM

## 2013-02-03 LAB — CBC
HCT: 35.2 % — ABNORMAL LOW (ref 36.0–46.0)
MCH: 27 pg (ref 26.0–34.0)
MCHC: 33.2 g/dL (ref 30.0–36.0)
MCV: 81.1 fL (ref 78.0–100.0)
Platelets: 273 10*3/uL (ref 150–400)
RDW: 14.9 % (ref 11.5–15.5)

## 2013-02-03 LAB — BASIC METABOLIC PANEL
BUN: 13 mg/dL (ref 6–23)
CO2: 27 mEq/L (ref 19–32)
Calcium: 8.1 mg/dL — ABNORMAL LOW (ref 8.4–10.5)
Creatinine, Ser: 0.81 mg/dL (ref 0.50–1.10)
GFR calc non Af Amer: 85 mL/min — ABNORMAL LOW (ref >90)
Glucose, Bld: 85 mg/dL (ref 70–99)
Sodium: 135 mEq/L (ref 135–145)

## 2013-02-03 MED ORDER — TRAZODONE HCL 50 MG PO TABS
50.0000 mg | ORAL_TABLET | Freq: Every evening | ORAL | Status: DC | PRN
  Filled 2013-02-03: qty 1

## 2013-02-03 MED ORDER — SIMETHICONE 80 MG PO CHEW
80.0000 mg | CHEWABLE_TABLET | Freq: Four times a day (QID) | ORAL | Status: DC | PRN
  Administered 2013-02-03: 80 mg via ORAL
  Filled 2013-02-03 (×2): qty 1

## 2013-02-03 MED ORDER — AMLODIPINE BESYLATE 10 MG PO TABS
10.0000 mg | ORAL_TABLET | Freq: Every day | ORAL | Status: DC
  Administered 2013-02-03 – 2013-02-04 (×2): 10 mg via ORAL
  Filled 2013-02-03 (×2): qty 1

## 2013-02-03 MED ORDER — DIPHENHYDRAMINE HCL 25 MG PO CAPS
25.0000 mg | ORAL_CAPSULE | Freq: Four times a day (QID) | ORAL | Status: DC | PRN
  Administered 2013-02-03: 25 mg via ORAL
  Filled 2013-02-03: qty 1

## 2013-02-03 NOTE — Progress Notes (Signed)
Subjective: Patient c/o some depression.  LFT's improved.  Magnesium now normal.  Has been able to ambulate around the unit.  Objective: Current vital signs: BP 160/77  Pulse 72  Temp(Src) 98.5 F (36.9 C) (Axillary)  Resp 15  Ht 5\' 3"  (1.6 m)  Wt 74.8 kg (164 lb 14.5 oz)  BMI 29.22 kg/m2  SpO2 95%  Breastfeeding? No Vital signs in last 24 hours: Temp:  [98 F (36.7 C)-98.5 F (36.9 C)] 98.5 F (36.9 C) (02/16 0000) Pulse Rate:  [67-82] 72 (02/16 0600) Resp:  [12-21] 15 (02/16 0600) BP: (127-165)/(62-83) 160/77 mmHg (02/16 0600) SpO2:  [92 %-98 %] 95 % (02/16 0600) Weight:  [74.8 kg (164 lb 14.5 oz)] 74.8 kg (164 lb 14.5 oz) (02/16 0500)  Intake/Output from previous day: 02/15 0701 - 02/16 0700 In: 600 [P.O.:600] Out: 6710 [Urine:6710] Intake/Output this shift:   Nutritional status: General  Neurologic Exam: Mental Status:  Alert, oriented, thought content appropriate. Speech fluent without evidence of aphasia. Able to follow 3 step commands without difficulty.  Cranial Nerves:  II: Discs flat bilaterally; Visual fields grossly normal, pupils equal, round, reactive to light and accommodation  III,IV, VI: ptosis not present, extra-ocular motions intact bilaterally  V,VII: smile symmetric, facial light touch sensation normal bilaterally  VIII: hearing normal bilaterally  IX,X: gag reflex present  XI: bilateral shoulder shrug  XII: midline tongue extension  Motor:  Right : Upper extremity 5/5         Left: Upper extremity 5/5   Lower extremity 5/5      Lower extremity 5/5  Tone and bulk:normal tone throughout; no atrophy noted  Sensory: Pinprick and light touch intact throughout, bilaterally  Deep Tendon Reflexes: 2+ with absent KJ's bilaterally  Plantars:  Right: downgoing     Left: downgoing   Lab Results: Basic Metabolic Panel:  Recent Labs Lab 01/30/13 2040 01/31/13 0745 02/01/13 0435 02/02/13 0600 02/03/13 0454  NA 133* 134* 134* 139 135  K 4.2 4.0  3.6 3.8 4.0  CL 101 99 98 105 102  CO2 19 20 23 25 27   GLUCOSE 71 74 122* 90 85  BUN 13 13 13 12 13   CREATININE 0.87 0.86 0.83 0.86 0.81  CALCIUM 7.6* 7.0* 6.8* 7.9* 8.1*  MG 5.6* 6.2* 4.8* 2.6* 2.1  PHOS  --   --   --  3.2  --     Liver Function Tests:  Recent Labs Lab 01/29/13 2210 01/30/13 0520 01/30/13 1521 01/31/13 0745 02/02/13 0600  AST 125* 127* 104* 79* 51*  ALT 78* 77* 65* 61* 46*  ALKPHOS 223* 201* 183* 186* 150*  BILITOT 0.2* 0.3 0.3 0.3 0.3  PROT 5.3* 5.5* 5.2* 5.6* 5.5*  ALBUMIN 2.1* 2.1* 2.0* 2.2* 2.1*   No results found for this basename: LIPASE, AMYLASE,  in the last 168 hours No results found for this basename: AMMONIA,  in the last 168 hours  CBC:  Recent Labs Lab 01/29/13 2210 01/30/13 0520 01/30/13 2040 01/31/13 0745 02/02/13 0600 02/03/13 0454  WBC 10.8* 10.3  --  10.7* 9.2 8.5  HGB 11.8* 11.5* 11.6* 11.2* 11.1* 11.7*  HCT 36.9 35.4* 34.8* 33.6* 34.8* 35.2*  MCV 83.1 81.0  --  80.2 81.7 81.1  PLT 201 199  --  236 255 273    Cardiac Enzymes:  Recent Labs Lab 01/29/13 2210  TROPONINI <0.30    Lipid Panel: No results found for this basename: CHOL, TRIG, HDL, CHOLHDL, VLDL, LDLCALC,  in the last  168 hours  CBG:  Recent Labs Lab 01/29/13 2224 01/31/13 0902 01/31/13 1135 01/31/13 1723  GLUCAP 70 75 91 106*    Microbiology: Results for orders placed during the hospital encounter of 01/29/13  MRSA PCR SCREENING     Status: None   Collection Time    01/30/13  3:00 AM      Result Value Range Status   MRSA by PCR NEGATIVE  NEGATIVE Final   Comment:            The GeneXpert MRSA Assay (FDA     approved for NASAL specimens     only), is one component of a     comprehensive MRSA colonization     surveillance program. It is not     intended to diagnose MRSA     infection nor to guide or     monitor treatment for     MRSA infections.    Coagulation Studies: No results found for this basename: LABPROT, INR,  in the last 72  hours  Imaging: Dg Chest Port 1 View  02/03/2013  *RADIOLOGY REPORT*  Clinical Data: Follow-up pleural effusions.  PORTABLE CHEST - 1 VIEW  Comparison: 02/02/2013 and prior chest radiographs  Findings: Mild enlargement of the cardiopericardial silhouette is unchanged. Mild pulmonary vascular congestion is again noted. Small bilateral pleural effusions are not significantly changed. There is no evidence of pneumothorax.  IMPRESSION: Stable chest radiograph with small bilateral pleural effusions.   Original Report Authenticated By: Harmon Pier, M.D.    Dg Chest Port 1 View  02/02/2013  *RADIOLOGY REPORT*  Clinical Data: Hypertension, vomiting and nausea.  PORTABLE CHEST - 1 VIEW  Comparison: Chest x-ray 02/01/2013.  Findings: Lung volumes are normal.  There is some bibasilar opacities favored to reflects small bilateral pleural effusions with associated subsegmental atelectasis in the lung bases (underlying air space consolidation is difficult to entirely excluded, particularly in the left lower lobe).  Pulmonary venous congestion, with mild indistinctness of the interstitial markings and some Kerley B lines in the periphery of the lungs, compatible with mild interstitial pulmonary edema.  Heart size is borderline to mildly enlarged.  Upper mediastinal contours are unremarkable.  IMPRESSION: 1.  The appearance of the chest suggests mild congestive heart failure, as detailed above.   Original Report Authenticated By: Trudie Reed, M.D.    Dg Chest Port 1 View  02/01/2013  *RADIOLOGY REPORT*  Clinical Data: Hypoxemia.  Weakness.  Recent cesarean section.  PORTABLE CHEST - 1 VIEW  Comparison: 12/25/2009.  Findings: Cardiac silhouette is borderline size accentuated by AP magnification.  No mediastinal or hilar lesions are seen.  There appear to be bilateral pleural effusions with associated basilar pulmonary infiltrative densities and atelectasis. No consolidation evident.  No skeletal lesions are seen.   IMPRESSION: Bilateral pleural effusions with associated basilar atelectasis and pulmonary infiltrative densities. No consolidation evident.   Original Report Authenticated By: Onalee Hua Call    Dg Abd Portable 1v  02/01/2013  *RADIOLOGY REPORT*  Clinical Data: Hypoxemia.  Weakness.  Nausea and vomiting.  Post cesarean section 3 days previously.  PORTABLE ABDOMEN - 1 VIEW  Comparison: None.  Findings: History given that the patient undergone recent cesarean section. Bowel gas distribution is consistent with this.  Surgical skin staples are seen over the lower pelvis.  There is fecal distention of portions of the colon.  There is gaseous distention of stomach.  No opaque calculi are seen.  No skeletal lesions are evident.  IMPRESSION: Bowel gas  configuration consistent with the recent pregnancy. Gaseous distention of stomach.  No significant small-bowel dilatation.  Fecal distention of portions of the colon.   Original Report Authenticated By: Onalee Hua Call     Medications:  I have reviewed the patient's current medications. Scheduled: . amLODipine  5 mg Oral Daily  . fluticasone  2 spray Each Nare Daily  . labetalol  200 mg Oral BID    Assessment/Plan: Patient doing well.  Anxious to be discharged.  BP remains mildly elevated with systolic greater than 160 this morning.  Still requires some prn Hydralazine.  Exam nonfocal.  No further seizures.    Recommendations: 1.  Would recommend systolic below 140.  Will increase Norvasc to 10mg  daily   LOS: 5 days   Thana Farr, MD [facility] [phone] 02/03/2013  8:06 AM

## 2013-02-03 NOTE — Progress Notes (Signed)
PULMONARY  / CRITICAL CARE MEDICINE  Name: Hannah Vega MRN: [id number] DOB: 07-31-1966    ADMISSION DATE:  01/29/2013 CONSULTATION DATE:  2/12  REFERRING MD :  Thad Ranger, Neurology  CHIEF COMPLAINT:  Eclampsia  BRIEF PATIENT DESCRIPTION: 47 y/o female with HTN who delivered twins via C-section on 2/11 developed seizures and eclampsia two hours post op.  Transferred to [facility] on 2/12 and PCCM asked to admit with neurology consulting.  SIGNIFICANT EVENTS / STUDIES:  2/11 c-section 2/11 2230, seizure, hypoxemic, cardiac arrest, CPR x4 minutes 2/11 CT head>> PRES vs. Occipital lobe infarct 2/11 MRI brain>> PRES 2/13 2 D echo >> EF 60 to 65%, mild/mod LA dilation, mod/large pericardial effusion, no tamponade.  LINES / TUBES: Peripheral IV  SUBJECTIVE:  Denies nausea, chest pain.  Breathing better.  Has trouble sleeping.  Anxious to go home.  VITAL SIGNS: Temp:  [98 F (36.7 C)-98.5 F (36.9 C)] 98.4 F (36.9 C) (02/16 0700) Pulse Rate:  [67-82] 75 (02/16 0800) Resp:  [12-21] 12 (02/16 0800) BP: (127-168)/(62-83) 168/81 mmHg (02/16 0800) SpO2:  [92 %-98 %] 96 % (02/16 0800) Weight:  [164 lb 14.5 oz (74.8 kg)] 164 lb 14.5 oz (74.8 kg) (02/16 0500)    INTAKE / OUTPUT: Intake/Output     02/15 0701 - 02/16 0700 02/16 0701 - 02/17 0700   P.O. 600    I.V. (mL/kg)     Total Intake(mL/kg) 600 (8)    Urine (mL/kg/hr) 6710 (3.7)    Total Output 6710     Net -6110            PHYSICAL EXAMINATION: Gen: No distress HEENT: Clear nasal discharge PULM: no wheeze CV: regular, no murmur AB: soft, non tender Ext: no edema Derm: No rashes Neuro:  Alert, normal strength   LABS:  Recent Labs Lab 01/29/13 1735 01/29/13 2210 01/29/13 2222  01/30/13 1521  01/31/13 0745 02/01/13 0435 02/02/13 0600 02/03/13 0454  HGB 12.0 11.8*  --   < >  --   < > 11.2*  --  11.1* 11.7*  WBC 9.0 10.8*  --   < >  --   --  10.7*  --  9.2 8.5  PLT 216 201  --   < >  --   --  236  --  255 273  NA  138 138  --   < > 133*  < > 134* 134* 139 135  K 4.4 4.4  --   < > 4.0  < > 4.0 3.6 3.8 4.0  CL 103 100  --   < > 100  < > 99 98 105 102  CO2 19 18*  --   < > 22  < > 20 23 25 27   GLUCOSE 70 90  --   < > 70  < > 74 122* 90 85  BUN 9 10  --   < > 12  < > 13 13 12 13   CREATININE 0.99 1.05  --   < > 1.02  < > 0.86 0.83 0.86 0.81  CALCIUM 9.7 8.8  --   < > 7.8*  < > 7.0* 6.8* 7.9* 8.1*  MG  --   --   --   < > 4.2*  < > 6.2* 4.8* 2.6* 2.1  PHOS  --   --   --   --   --   --   --   --  3.2  --   AST 35 125*  --   < >  104*  --  79*  --  51*  --   ALT 23 78*  --   < > 65*  --  61*  --  46*  --   ALKPHOS 247* 223*  --   < > 183*  --  186*  --  150*  --   BILITOT 0.4 0.2*  --   < > 0.3  --  0.3  --  0.3  --   PROT 6.1 5.3*  --   < > 5.2*  --  5.6*  --  5.5*  --   ALBUMIN 2.5* 2.1*  --   < > 2.0*  --  2.2*  --  2.1*  --   TROPONINI  --  <0.30  --   --   --   --   --   --   --   --   PHART  --   --  7.343*  --   --   --   --   --   --   --   PCO2ART  --   --  35.3  --   --   --   --   --   --   --   PO2ART  --   --  85.0  --   --   --   --   --   --   --   < > = values in this interval not displayed.  Recent Labs Lab 01/29/13 2224 01/31/13 0902 01/31/13 1135 01/31/13 1723  GLUCAP 70 75 91 106*    Imaging: Dg Chest Port 1 View  02/03/2013  *RADIOLOGY REPORT*  Clinical Data: Follow-up pleural effusions.  PORTABLE CHEST - 1 VIEW  Comparison: 02/02/2013 and prior chest radiographs  Findings: Mild enlargement of the cardiopericardial silhouette is unchanged. Mild pulmonary vascular congestion is again noted. Small bilateral pleural effusions are not significantly changed. There is no evidence of pneumothorax.  IMPRESSION: Stable chest radiograph with small bilateral pleural effusions.   Original Report Authenticated By: Harmon Pier, M.D.    Dg Chest Port 1 View  02/02/2013  *RADIOLOGY REPORT*  Clinical Data: Hypertension, vomiting and nausea.  PORTABLE CHEST - 1 VIEW  Comparison: Chest x-ray  02/01/2013.  Findings: Lung volumes are normal.  There is some bibasilar opacities favored to reflects small bilateral pleural effusions with associated subsegmental atelectasis in the lung bases (underlying air space consolidation is difficult to entirely excluded, particularly in the left lower lobe).  Pulmonary venous congestion, with mild indistinctness of the interstitial markings and some Kerley B lines in the periphery of the lungs, compatible with mild interstitial pulmonary edema.  Heart size is borderline to mildly enlarged.  Upper mediastinal contours are unremarkable.  IMPRESSION: 1.  The appearance of the chest suggests mild congestive heart failure, as detailed above.   Original Report Authenticated By: Trudie Reed, M.D.    Dg Chest Port 1 View  02/01/2013  *RADIOLOGY REPORT*  Clinical Data: Hypoxemia.  Weakness.  Recent cesarean section.  PORTABLE CHEST - 1 VIEW  Comparison: 12/25/2009.  Findings: Cardiac silhouette is borderline size accentuated by AP magnification.  No mediastinal or hilar lesions are seen.  There appear to be bilateral pleural effusions with associated basilar pulmonary infiltrative densities and atelectasis. No consolidation evident.  No skeletal lesions are seen.  IMPRESSION: Bilateral pleural effusions with associated basilar atelectasis and pulmonary infiltrative densities. No consolidation evident.   Original Report Authenticated By: Onalee Hua Call    Dg Abd  Portable 1v  02/01/2013  *RADIOLOGY REPORT*  Clinical Data: Hypoxemia.  Weakness.  Nausea and vomiting.  Post cesarean section 3 days previously.  PORTABLE ABDOMEN - 1 VIEW  Comparison: None.  Findings: History given that the patient undergone recent cesarean section. Bowel gas distribution is consistent with this.  Surgical skin staples are seen over the lower pelvis.  There is fecal distention of portions of the colon.  There is gaseous distention of stomach.  No opaque calculi are seen.  No skeletal lesions are  evident.  IMPRESSION: Bowel gas configuration consistent with the recent pregnancy. Gaseous distention of stomach.  No significant small-bowel dilatation.  Fecal distention of portions of the colon.   Original Report Authenticated By: Onalee Hua Call      ASSESSMENT / PLAN:  NEUROLOGIC A:  PRES/Eclampsia Normal EEG as of 2/12.  Off magnesium 2/14. Insomnia. P:   Monitor neurologic status PRN trazodone qhs  CARDIOVASCULAR A: Eclampsia. Hypertension. Pericardial effusion on Echo 2/13 >> no evidence for tamponade. P:  Labetalol 200 mg bid Agree with increase in amlodipine 10 mg qd on 2/16 PRN labetalol, hydralazine for goal SBP < 160 Will need repeat Echo 2/17 Monitor hemodynamics  RENAL A: Hypervolemia >> improved 2/16. P:   Hold further lasix for now D/c foley  PULMONARY A: Rhinitis. P: Avoid afrin due to concern for affect on blood pressure Flonase, ocean spray  GASTROINTESTINAL A: Nutrition. P:   Regular diet.  OBSTETRICS A: s/p C-section. P: F/u with OB as outpt  Resolved problems >> Cardiac arrest 2nd to hypoxia/seizure, nausea/vomiting, acute hypoxic respiratory distress, anemia   Updated family at bedside.  Transfer to SDU.  Hopefully ready for d/c home in next 24 to 48 hrs.  Coralyn Helling, MD [facility] Pulmonary/Critical Care 02/03/2013, 9:52 AM Pager:  [phone] After 3pm call: [phone]

## 2013-02-04 LAB — COMPREHENSIVE METABOLIC PANEL
AST: 33 U/L (ref 0–37)
Albumin: 2.3 g/dL — ABNORMAL LOW (ref 3.5–5.2)
BUN: 13 mg/dL (ref 6–23)
Calcium: 8.6 mg/dL (ref 8.4–10.5)
Creatinine, Ser: 0.77 mg/dL (ref 0.50–1.10)
GFR calc Af Amer: >90 mL/min (ref >90)
Total Bilirubin: 0.3 mg/dL (ref 0.3–1.2)

## 2013-02-04 LAB — CBC
HCT: 37.2 % (ref 36.0–46.0)
MCHC: 32.5 g/dL (ref 30.0–36.0)
RDW: 14.9 % (ref 11.5–15.5)

## 2013-02-04 MED ORDER — LABETALOL HCL 200 MG PO TABS: 200.0000 mg | ORAL_TABLET | Freq: Two times a day (BID) | ORAL | Status: DC

## 2013-02-04 MED ORDER — SODIUM CHLORIDE 0.9 % IJ SOLN
INTRAMUSCULAR | Status: AC
  Filled 2013-02-04: qty 10

## 2013-02-04 MED ORDER — FLUTICASONE PROPIONATE 50 MCG/ACT NA SUSP: 2.0000 | Freq: Every day | NASAL | Status: DC

## 2013-02-04 MED ORDER — IBUPROFEN 200 MG PO TABS: 200.0000 mg | ORAL_TABLET | Freq: Four times a day (QID) | ORAL | Status: DC | PRN

## 2013-02-04 MED ORDER — WITCH HAZEL-GLYCERIN EX PADS: 1.0000 "application " | MEDICATED_PAD | CUTANEOUS | Status: DC | PRN

## 2013-02-04 MED ORDER — AMLODIPINE BESYLATE 10 MG PO TABS: 10.0000 mg | ORAL_TABLET | Freq: Every day | ORAL | Status: DC

## 2013-02-04 MED ORDER — OXYCODONE-ACETAMINOPHEN 5-325 MG PO TABS: 1.0000 | ORAL_TABLET | ORAL | Status: DC | PRN

## 2013-02-04 NOTE — Discharge Summary (Signed)
Physician Discharge Summary  Patient ID: LATASIA SILBERSTEIN MRN: [id number] DOB/AGE: [birth year] 49 y.o.  Admit date: 01/29/2013 Discharge date: 02/04/2013    Discharge Diagnoses:  Eclampsia  Status Post C-Section  PRES (posterior reversible encephalopathy syndrome) Other convulsions HTN Hypervolemia Cardiac Arrest  Pleural Effusions Acute Hypoxic Respiratory Distress Rhinitis Pericardial effusion Elevated LFT's Nausea / Vomiting Anemia    Brief Summary: TATYANA BIBER is a 47 y.o. y/o female with a PMH of HTN prior to pregnancy who delivered twins via C-section on 01/29/13.  Approximately 2 hours post op she developed hypoxemia and then seizure like activity witnessed by her nurse. She then developed cardiac arrest (PEA?) and received 4 minutes of CPR and regained spontaneous respirations afterwards (thought to have been hypoxemic respiratory failure after seizure). She was never intubated. She was given 4gm magnesium and started on a magnesium gtt. Imaging studies showed evidence of PRES (CT head and MRI).   She was transferred to [facility] Neuro ICU.  Patient placed on magnesium gtt, aggressive blood pressure control instituted with labetalol, PRN hydralazine.  ECHO evaluated which demonstrated moderate to large pericardial effusion with no evidence of tamponade.  Repeat ECHO performed 2/17 with improvement in pericardial effusion.  Cardiology & Neurology consulted during hospitalization.     SIGNIFICANT EVENTS / STUDIES:  2/11 c-section  2/11 2230, seizure, hypoxemic, cardiac arrest, CPR x4 minutes  2/11 CT head>> PRES vs. Occipital lobe infarct  2/11 MRI brain>> PRES  2/13 2 D echo >> EF 60 to 65%, mild/mod LA dilation, mod/large pericardial effusion, no tamponade.  2/17 ECHO>>>EF 60-65%, nml wall motion, small free flowing pericardial effusion, no tamponade, left pleural effusion   LINES / TUBES:  Peripheral IV  Microbiology/Sepsis markers: 2/12 MRSA PCR>>>neg  Significant  Diagnostic Studies:  2/12 CT HEAD>>>Subtle hypodensity in the occipital lobes bilaterally may be manifestation of posterior reversible encephalopathy syndrome (PRES) or less likely occipital lobe infarct.  2/12 MRI HEAD>>>Edema in the occipital lobes and to a lesser extent the parietal lobes bilaterally, right more than left, most consistent with posterior reversible encephalopathy. None of these areas shows restricted diffusion to suggest actual infarction. No hemorrhage.                                                                    Hospital Summary by Discharge Diagnosis  Eclampsia  Status Post C-Section  PRES (posterior reversible encephalopathy syndrome) Other convulsions HTN Hypervolemia Cardiac Arrest  Pleural Effusions Acute Hypoxic Respiratory Distress Pericardial effusion 47 y/o F with PMH of HTN prior to pregnancy on labetalol who delivered twins via C-section on 01/29/13.  Approximately 2 hours post op she developed hypoxemia and then seizure like activity witnessed by her nurse. She then developed cardiac arrest (PEA?) and received 4 minutes of CPR and regained spontaneous respirations afterwards (thought to have been hypoxemic respiratory failure after seizure). She was never intubated. She was given 4gm magnesium and started on a magnesium gtt. Imaging studies showed evidence of PRES (CT head and MRI).   She was transferred to [facility] Neuro ICU.  Patient placed on magnesium gtt, aggressive blood pressure control instituted with labetalol, PRN hydralazine.  ECHO evaluated which demonstrated moderate to large pericardial effusion with no evidence of tamponade.  Repeat ECHO  performed 2/17 with improvement in pericardial effusion.  Sutures removed per OB prior to discharge.     Discharge Plan: -Labetalol 200 mg bid  -amlodipine 10 mg qd  -follow up with Dr. Billy Coast on 2/21, with consideration for repeat CBC, BMP -BP goal <140 -Parameters for patient to check BP BID and record  (see below) -further BP management per Dr. Foy Guadalajara.  May need to back off HTN regimen as she continues to stabilize from cardiovascular standpoint -wound care as below -OB / GYN parameters as below   Rhinitis Allergic Rhinitis.  Patient uses netty pot at home prior to admit.    Discharge Plan: -Avoid afrin due to concern for affect on blood pressure  -Flonase, ocean spray   Elevated LFT's Nausea / Vomiting Nausea / vomiting post c-section.  No further n/v at time of discharge. Elevated LFT's likely related to hypoperfusion and were trending down / near normal at time of discharge.  Discharge Plan: -no further interventions  Anemia Anemia post c-section and acute critical illness.  Hbg stable prior to discharge (12.1)  Discharge Plan: -continue prenatal vitamin   Discharge Exam: Gen: No distress  HEENT: Clear nasal discharge  PULM: no wheeze  CV: regular, no murmur  AB: soft, non tender  Ext: no edema  Derm: No rashes  Neuro: Alert, normal strength   Filed Vitals:   02/04/13 0500 02/04/13 0800 02/04/13 1200 02/04/13 1600  BP:  160/71 154/64 163/73  Pulse:  79 85 86  Temp:  97.8 F (36.6 C) 97.8 F (36.6 C) 97.9 F (36.6 C)  TempSrc:  Oral Oral Oral  Resp:  21 16 18   Height:      Weight: 155 lb 10.3 oz (70.6 kg)     SpO2:  97% 98% 100%     Discharge Labs  BMET  Recent Labs Lab 01/30/13 2040 01/31/13 0745 02/01/13 0435 02/02/13 0600 02/03/13 0454 02/04/13 0530  NA 133* 134* 134* 139 135 140  K 4.2 4.0 3.6 3.8 4.0 3.9  CL 101 99 98 105 102 106  CO2 19 20 23 25 27 22   GLUCOSE 71 74 122* 90 85 106*  BUN 13 13 13 12 13 13   CREATININE 0.87 0.86 0.83 0.86 0.81 0.77  CALCIUM 7.6* 7.0* 6.8* 7.9* 8.1* 8.6  MG 5.6* 6.2* 4.8* 2.6* 2.1  --   PHOS  --   --   --  3.2  --   --    CBC  Recent Labs Lab 02/02/13 0600 02/03/13 0454 02/04/13 0530  HGB 11.1* 11.7* 12.1  HCT 34.8* 35.2* 37.2  WBC 9.2 8.5 8.2  PLT 255 273 303   Hepatic Function Panel      Component Value Date/Time   PROT 6.1 02/04/2013 0530   ALBUMIN 2.3* 02/04/2013 0530   AST 33 02/04/2013 0530   ALT 47* 02/04/2013 0530   ALKPHOS 144* 02/04/2013 0530   BILITOT 0.3 02/04/2013 0530     DISCHARGE INSTRUCTIONS Discharge Orders   Future Orders Complete By Expires     Call MD for:  difficulty breathing, headache or visual disturbances  As directed     Call MD for:  persistant dizziness or light-headedness  As directed     Call MD for:  persistant nausea and vomiting  As directed     Call MD for:  redness, tenderness, or signs of infection (pain, swelling, redness, odor or green/yellow discharge around incision site)  As directed     Call MD for:  severe uncontrolled pain  As directed     Call MD for:  temperature >100.4  As directed     Diet - low sodium heart healthy  As directed     Discharge instructions  As directed     Comments:      Take and record Blood Pressures morning and night.  If BP is elevated, have patient sit down and wait 30 minutes and repeat.  IF BP consistently greater than 175 -180 call MD.   Caution with position changes as you may get dizzy with medications and changes in blood pressures. No sex, tampons, douching or inserting anything into vagina for 8 weeks Limit stairs to 1 round trip per day Ok to shower.  Do not submerge incision. Keep incision clean and dry.  Call MD if any reddness, drainage or heat from site.    Increase activity slowly  As directed        FOLLOW UP     Follow-up Information   Follow up with Lenora Boys, MD On 02/21/2013. (Appt at 10:30 AM)    Contact information:   [address] [place] [address] [phone]       Follow up with Lenoard Aden, MD On 02/08/2013. (Appt at 2:45)    Contact information:   [address] [place] [address] [phone]       DISCHARGE MEDICATIONS   Medication List    STOP taking these medications       acetaminophen 500 MG tablet  Commonly known as:  TYLENOL      diphenhydrAMINE 2 % cream  Commonly known as:  BENADRYL     phenylephrine 0.5 % nasal solution  Commonly known as:  NEO-SYNEPHRINE      TAKE these medications       amLODipine 10 MG tablet  Commonly known as:  NORVASC  Take 1 tablet (10 mg total) by mouth daily.     fluticasone 50 MCG/ACT nasal spray  Commonly known as:  FLONASE  Place 2 sprays into the nose daily.     ibuprofen 200 MG tablet  Commonly known as:  MOTRIN IB  Take 1 tablet (200 mg total) by mouth every 6 (six) hours as needed for pain.     labetalol 200 MG tablet  Commonly known as:  NORMODYNE  Take 1 tablet (200 mg total) by mouth 2 (two) times daily.     oxyCODONE-acetaminophen 5-325 MG per tablet  Commonly known as:  PERCOCET/ROXICET  Take 1 tablet by mouth every 4 (four) hours as needed.     prenatal multivitamin Tabs  Take 1 tablet by mouth daily.     TUMS 500 MG chewable tablet  Generic drug:  calcium carbonate  Chew 2 tablets by mouth 3 (three) times daily as needed for heartburn.     witch hazel-glycerin pad  Commonly known as:  TUCKS  Apply 1 application topically as needed.          Disposition: 01-Home or Self Care  Discharged Condition: FATISHA RABALAIS has met maximum benefit of inpatient care and is medically stable and cleared for discharge.  Patient is pending follow up as above.      Time spent on disposition:  Greater than 35 minutes.     Canary Brim, NP-C [facility] Pgr: [phone]    Coralyn Helling, MD [facility] Pulmonary/Critical Care 02/05/2013, 8:37 AM Pager:  [phone] After 3pm call: [phone]

## 2013-02-04 NOTE — Progress Notes (Signed)
Patient d/c'd home per MD order. Patient and family given d/c instructions/prescriptions and all questions answered. Patient d/c'd via wheelchair with NS and family.

## 2013-02-04 NOTE — Progress Notes (Signed)
Subjective: LFT's continue to improve.  BP remains elevated.  Objective: Current vital signs: BP 154/64  Pulse 85  Temp(Src) 97.8 F (36.6 C) (Oral)  Resp 16  Ht 5\' 3"  (1.6 m)  Wt 70.6 kg (155 lb 10.3 oz)  BMI 27.58 kg/m2  SpO2 98%  Breastfeeding? No Vital signs in last 24 hours: Temp:  [97.6 F (36.4 C)-98 F (36.7 C)] 97.8 F (36.6 C) (02/17 1200) Pulse Rate:  [79-88] 85 (02/17 1200) Resp:  [15-22] 16 (02/17 1200) BP: (142-166)/(64-78) 154/64 mmHg (02/17 1200) SpO2:  [97 %-100 %] 98 % (02/17 1200) Weight:  [70.6 kg (155 lb 10.3 oz)] 70.6 kg (155 lb 10.3 oz) (02/17 0500)  Intake/Output from previous day: 02/16 0701 - 02/17 0700 In: 1320 [P.O.:1320] Out: 4100 [Urine:4100] Intake/Output this shift: Total I/O In: 200 [P.O.:200] Out: 0  Nutritional status: General  Neurologic Exam: Mental Status:  Alert, oriented, thought content appropriate. Speech fluent without evidence of aphasia. Able to follow 3 step commands without difficulty.  Cranial Nerves:  II: Discs flat bilaterally; Visual fields grossly normal, pupils equal, round, reactive to light and accommodation  III,IV, VI: ptosis not present, extra-ocular motions intact bilaterally  V,VII: smile symmetric, facial light touch sensation normal bilaterally  VIII: hearing normal bilaterally  IX,X: gag reflex present  XI: bilateral shoulder shrug  XII: midline tongue extension  Motor:  Right : Upper extremity 5/5           Left: Upper extremity 5/5   Lower extremity 5/5        Lower extremity 5/5  Tone and bulk:normal tone throughout; no atrophy noted  Sensory: Pinprick and light touch intact throughout, bilaterally  Deep Tendon Reflexes: 2+ with absent KJ's bilaterally  Plantars:  Right: downgoing      Left: downgoing    Lab Results: Basic Metabolic Panel:  Recent Labs Lab 01/30/13 2040 01/31/13 0745 02/01/13 0435 02/02/13 0600 02/03/13 0454 02/04/13 0530  NA 133* 134* 134* 139 135 140  K 4.2 4.0 3.6  3.8 4.0 3.9  CL 101 99 98 105 102 106  CO2 19 20 23 25 27 22   GLUCOSE 71 74 122* 90 85 106*  BUN 13 13 13 12 13 13   CREATININE 0.87 0.86 0.83 0.86 0.81 0.77  CALCIUM 7.6* 7.0* 6.8* 7.9* 8.1* 8.6  MG 5.6* 6.2* 4.8* 2.6* 2.1  --   PHOS  --   --   --  3.2  --   --     Liver Function Tests:  Recent Labs Lab 01/30/13 0520 01/30/13 1521 01/31/13 0745 02/02/13 0600 02/04/13 0530  AST 127* 104* 79* 51* 33  ALT 77* 65* 61* 46* 47*  ALKPHOS 201* 183* 186* 150* 144*  BILITOT 0.3 0.3 0.3 0.3 0.3  PROT 5.5* 5.2* 5.6* 5.5* 6.1  ALBUMIN 2.1* 2.0* 2.2* 2.1* 2.3*   No results found for this basename: LIPASE, AMYLASE,  in the last 168 hours No results found for this basename: AMMONIA,  in the last 168 hours  CBC:  Recent Labs Lab 01/30/13 0520 01/30/13 2040 01/31/13 0745 02/02/13 0600 02/03/13 0454 02/04/13 0530  WBC 10.3  --  10.7* 9.2 8.5 8.2  HGB 11.5* 11.6* 11.2* 11.1* 11.7* 12.1  HCT 35.4* 34.8* 33.6* 34.8* 35.2* 37.2  MCV 81.0  --  80.2 81.7 81.1 81.6  PLT 199  --  236 255 273 303    Cardiac Enzymes:  Recent Labs Lab 01/29/13 2210  TROPONINI <0.30    Lipid Panel:  No results found for this basename: CHOL, TRIG, HDL, CHOLHDL, VLDL, LDLCALC,  in the last 168 hours  CBG:  Recent Labs Lab 01/29/13 2224 01/31/13 0902 01/31/13 1135 01/31/13 1723  GLUCAP 70 75 91 106*    Microbiology: Results for orders placed during the hospital encounter of 01/29/13  MRSA PCR SCREENING     Status: None   Collection Time    01/30/13  3:00 AM      Result Value Range Status   MRSA by PCR NEGATIVE  NEGATIVE Final   Comment:            The GeneXpert MRSA Assay (FDA     approved for NASAL specimens     only), is one component of a     comprehensive MRSA colonization     surveillance program. It is not     intended to diagnose MRSA     infection nor to guide or     monitor treatment for     MRSA infections.    Coagulation Studies: No results found for this basename:  LABPROT, INR,  in the last 72 hours  Imaging: Dg Chest Port 1 View  02/03/2013  *RADIOLOGY REPORT*  Clinical Data: Follow-up pleural effusions.  PORTABLE CHEST - 1 VIEW  Comparison: 02/02/2013 and prior chest radiographs  Findings: Mild enlargement of the cardiopericardial silhouette is unchanged. Mild pulmonary vascular congestion is again noted. Small bilateral pleural effusions are not significantly changed. There is no evidence of pneumothorax.  IMPRESSION: Stable chest radiograph with small bilateral pleural effusions.   Original Report Authenticated By: Harmon Pier, M.D.     Medications:  I have reviewed the patient's current medications. Scheduled: . amLODipine  10 mg Oral Daily  . fluticasone  2 spray Each Nare Daily  . labetalol  200 mg Oral BID  . sodium chloride        Assessment/Plan: Patient doing well. Anxious to be discharged. BP remains mildly elevated with systolic greater than 160 this morning. No prn hydralazine required for the past 24 hours.  Recommendations: 1.  BP control-target BP less than 140    LOS: 6 days   Thana Farr, MD [facility] [phone] 02/04/2013  3:26 PM

## 2013-02-04 NOTE — Progress Notes (Signed)
  Echocardiogram 2D Echocardiogram limited has been performed.  Coral Soler FRANCES 02/04/2013, 10:17 AM

## 2014-06-25 ENCOUNTER — Other Ambulatory Visit: Payer: Self-pay

## 2014-06-25 DIAGNOSIS — Z1231 Encounter for screening mammogram for malignant neoplasm of breast: Secondary | ICD-10-CM

## 2014-06-27 ENCOUNTER — Ambulatory Visit
Admission: RE | Admit: 2014-06-27 | Discharge: 2014-06-27 | Disposition: A | Discharge status: Home / Self Care | Payer: 59 | Source: Ambulatory Visit

## 2014-06-27 DIAGNOSIS — Z1231 Encounter for screening mammogram for malignant neoplasm of breast: Secondary | ICD-10-CM

## 2014-09-01 ENCOUNTER — Encounter: Payer: Self-pay | Admitting: General Surgery

## 2014-10-20 ENCOUNTER — Encounter: Payer: Self-pay | Admitting: General Surgery

## 2015-11-01 ENCOUNTER — Emergency Department (HOSPITAL_BASED_OUTPATIENT_CLINIC_OR_DEPARTMENT_OTHER): Payer: 59

## 2015-11-01 ENCOUNTER — Emergency Department (HOSPITAL_BASED_OUTPATIENT_CLINIC_OR_DEPARTMENT_OTHER)
Admission: EM | Admit: 2015-11-01 | Discharge: 2015-11-01 | Disposition: A | Discharge status: Home / Self Care | Payer: 59 | Attending: Emergency Medicine | Admitting: Emergency Medicine

## 2015-11-01 ENCOUNTER — Encounter (HOSPITAL_BASED_OUTPATIENT_CLINIC_OR_DEPARTMENT_OTHER): Payer: Self-pay | Admitting: *Deleted

## 2015-11-01 DIAGNOSIS — R079 Chest pain, unspecified: Secondary | ICD-10-CM | POA: Diagnosis not present

## 2015-11-01 DIAGNOSIS — R0602 Shortness of breath: Secondary | ICD-10-CM | POA: Insufficient documentation

## 2015-11-01 DIAGNOSIS — I10 Essential (primary) hypertension: Secondary | ICD-10-CM | POA: Insufficient documentation

## 2015-11-01 DIAGNOSIS — Z79899 Other long term (current) drug therapy: Secondary | ICD-10-CM | POA: Diagnosis not present

## 2015-11-01 DIAGNOSIS — Z872 Personal history of diseases of the skin and subcutaneous tissue: Secondary | ICD-10-CM | POA: Insufficient documentation

## 2015-11-01 DIAGNOSIS — Z8742 Personal history of other diseases of the female genital tract: Secondary | ICD-10-CM | POA: Insufficient documentation

## 2015-11-01 DIAGNOSIS — Z3202 Encounter for pregnancy test, result negative: Secondary | ICD-10-CM | POA: Insufficient documentation

## 2015-11-01 LAB — HCG, SERUM, QUALITATIVE: Preg, Serum: NEGATIVE

## 2015-11-01 LAB — BASIC METABOLIC PANEL
Anion gap: 9 (ref 5–15)
BUN: 10 mg/dL (ref 6–20)
CHLORIDE: 107 mmol/L (ref 101–111)
CO2: 23 mmol/L (ref 22–32)
CREATININE: 0.74 mg/dL (ref 0.44–1.00)
Calcium: 9.3 mg/dL (ref 8.9–10.3)
GFR calc non Af Amer: >60 mL/min (ref >60)
Glucose, Bld: 93 mg/dL (ref 65–99)
POTASSIUM: 3.6 mmol/L (ref 3.5–5.1)
Sodium: 139 mmol/L (ref 135–145)

## 2015-11-01 LAB — CBC
HEMATOCRIT: 41 % (ref 36.0–46.0)
Hemoglobin: 13.9 g/dL (ref 12.0–15.0)
MCH: 29.4 pg (ref 26.0–34.0)
MCHC: 33.9 g/dL (ref 30.0–36.0)
MCV: 86.7 fL (ref 78.0–100.0)
PLATELETS: 244 10*3/uL (ref 150–400)
RBC: 4.73 MIL/uL (ref 3.87–5.11)
RDW: 12.7 % (ref 11.5–15.5)
WBC: 6.5 10*3/uL (ref 4.0–10.5)

## 2015-11-01 LAB — TROPONIN I: Troponin I: <0.03 ng/mL (ref <0.031)

## 2015-11-01 MED ORDER — ASPIRIN 81 MG PO CHEW
324.0000 mg | CHEWABLE_TABLET | Freq: Once | ORAL | Status: AC
  Administered 2015-11-01: 324 mg via ORAL
  Filled 2015-11-01: qty 4

## 2015-11-01 NOTE — ED Notes (Signed)
MD at bedside. 

## 2015-11-01 NOTE — ED Notes (Signed)
Pt reports intermittent epigastric pressure since yesterday morning- States that she feels like she need to burp- pt reports very strong family history of cardiac disease

## 2015-11-01 NOTE — ED Provider Notes (Signed)
CSN: [id number]     Arrival date & time 11/01/15  0907 History   First MD Initiated Contact with Patient 11/01/15 [phone]     Chief Complaint  Patient presents with  . Chest Pain     (Consider location/radiation/quality/duration/timing/severity/associated sxs/prior Treatment) HPI Comments: 49 y.o. Female with history of HTN presents for chest pain.  The patient reports that yesterday while driving in her car she developed pressure, knot-like pain in the middle of her chest with associated shortness of breath.  Since then she has had multiple episodes of the same all that resolved on their own.  She denies having pain like this before.  None of the episodes were brought on be exertion.  Denies increased stress.  Patient currently feels fine but is very anxious as she had an episode this morning after sleeping through the night very comfortably.  She reports an extensive family history with her dad dying at 74 y.o. Secondary to MI and her brother having bypass in his 74s and her mother having an MI in her 36s.  She reports a normal stress test in 2011.  Patient is a 49 y.o. female presenting with chest pain.  Chest Pain Associated symptoms: shortness of breath   Associated symptoms: no abdominal pain, no back pain, no cough, no dizziness, no fatigue, no fever, no headache, no palpitations and no weakness     Past Medical History  Diagnosis Date  . Hypertension   . Female infertility   . Eclampsia in delivered patient 01/30/2013  . Hand dermatitis    Past Surgical History  Procedure Laterality Date  . Tympanostomy tube placement    . Tonsillectomy    . Cesarean section N/A 01/29/2013    Procedure: CESAREAN SECTION  twins 35 weeks;  Surgeon: Lenoard Aden, MD;  Location: [facility];  Service: Obstetrics;  Laterality: N/A;  Primary C/S   EDD: 03/05/13   Family History  Problem Relation Age of Onset  . Hypertension Mother   . Heart attack Father   . Heart disease Father   . Hypertension  Father   . Hypertension Sister   . Hyperlipidemia Brother   . Hypertension Brother    Social History  Substance Use Topics  . Smoking status: Never Smoker   . Smokeless tobacco: Never Used  . Alcohol Use: Yes     Comment: rare   OB History    Gravida Para Term Preterm AB TAB SAB Ectopic Multiple Living   Review of Systems  Constitutional: Negative for fever, chills, appetite change and fatigue.  HENT: Negative for congestion, ear pain, rhinorrhea and sinus pressure.   Respiratory: Positive for shortness of breath. Negative for cough, chest tightness and wheezing.   Cardiovascular: Positive for chest pain. Negative for palpitations and leg swelling.  Gastrointestinal: Negative for abdominal pain, diarrhea and constipation.  Genitourinary: Negative for dysuria and urgency.  Musculoskeletal: Negative for myalgias, back pain and arthralgias.  Skin: Negative for rash.  Neurological: Negative for dizziness, weakness, light-headedness and headaches.      Allergies  Codeine; Erythromycin; and Phenergan  Home Medications   Prior to Admission medications   Medication Sig Start Date End Date Taking? Authorizing Provider  nebivolol (BYSTOLIC) 5 MG tablet Take 5 mg by mouth daily.   Yes Historical Provider, MD  amLODipine (NORVASC) 10 MG tablet Take 5 mg by mouth daily. 02/04/13   Jeanella Craze, NP  BP 129/71 mmHg  Pulse 68  Temp(Src) 98.4 F (36.9 C) (Oral)  Resp 17  SpO2 100%  LMP 10/30/2015 Physical Exam  Constitutional: She is oriented to person, place, and time. She appears well-developed and well-nourished. No distress.  HENT:  Head: Normocephalic and atraumatic.  Right Ear: External ear normal.  Left Ear: External ear normal.  Nose: Nose normal.  Mouth/Throat: Oropharynx is clear and moist. No oropharyngeal exudate.  Eyes: EOM are normal. Pupils are equal, round, and reactive to light.  Neck: Normal range of motion. Neck supple.  Cardiovascular:  Normal rate, regular rhythm, normal heart sounds and intact distal pulses.   No murmur heard. Pulmonary/Chest: Effort normal. No respiratory distress. She has no wheezes. She has no rales.  Abdominal: Soft. She exhibits no distension. There is no tenderness.  Musculoskeletal: Normal range of motion. She exhibits no edema or tenderness.  Neurological: She is alert and oriented to person, place, and time.  Skin: Skin is warm and dry. No rash noted. She is not diaphoretic.  Vitals reviewed.   ED Course  Procedures (including critical care time) Labs Review Labs Reviewed  BASIC METABOLIC PANEL  CBC  TROPONIN I  HCG, SERUM, QUALITATIVE    Imaging Review Dg Chest 2 View  11/01/2015  CLINICAL DATA:  Sternal chest pressure and shortness breath for 2 days. EXAM: CHEST  2 VIEW COMPARISON:  Chest x-ray dated 02/03/2013. FINDINGS: The heart size and mediastinal contours are within normal limits. Both lungs are clear. Lung volumes are upper normal. Mild degenerative change noted within the slightly kyphotic thoracic spine. A mild wedge compression deformity involving the superior endplate of an upper thoracic vertebral body is new compared to a lateral chest x-ray of 2011, of uncertain age. IMPRESSION: 1. Lungs are clear and there is no evidence of acute cardiopulmonary abnormality. 2. Mild wedge compression fracture deformity of an upper thoracic vertebral body, of uncertain age but new compared to previous chest x-ray of 12/25/2009. I suspect this is chronic but, again, it is of uncertain age with no comparison lateral films between 2011 and today's exam. Any recent injury or focal midline back pain in the upper thoracic levels? . Electronically Signed   By: Bary RichardStan  Maynard M.D.   On: 11/01/2015 09:53   I have personally reviewed and evaluated these images and lab results as part of my medical decision-making.   EKG Interpretation   Date/Time:  Sunday November 01 2015 09:12:22 EST Ventricular Rate:   72 PR Interval:  130 QRS Duration: 82 QT Interval:  402 QTC Calculation: 440 R Axis:   69 Text Interpretation:  Normal sinus rhythm Normal ECG No significant change  since last tracing Reconfirmed by Ariabella Brien ([id number]) on 11/01/2015  10:06:53 AM      MDM  Patient seen and evaluated in stable condition.  EKG unremarkable.  Laboratory studies unremarkable.  Symptoms with onset yesterday.  HeartScore 2.  Patient without pain in the ED but was given ASA.  Discussed results, Heartscore, clinical impression at bedside with patient and her husband who expressed understanding and agreement with plan for discharged and close outpatient follow up.  Patient given strict return precautions.  Patient discharged home in stable condition.   Final diagnoses:  Chest pain, unspecified chest pain type    1. Chest pain   Leta BaptistEmily Roe Livvy Spilman, MD 11/01/15 2325

## 2015-11-01 NOTE — Discharge Instructions (Signed)
The exact cause of your chest pain is uncertain at this time.  It does not appear you had a heart attack.  Return with sudden, severe symptoms.  Take aspirin daily.  Follow up with your primary care doctor and/or cardiology outpatient for more heart testing in the outpatient setting.  Nonspecific Chest Pain  Chest pain can be caused by many different conditions. There is always a chance that your pain could be related to something serious, such as a heart attack or a blood clot in your lungs. Chest pain can also be caused by conditions that are not life-threatening. If you have chest pain, it is very important to follow up with your health care provider. CAUSES  Chest pain can be caused by:  Heartburn.  Pneumonia or bronchitis.  Anxiety or stress.  Inflammation around your heart (pericarditis) or lung (pleuritis or pleurisy).  A blood clot in your lung.  A collapsed lung (pneumothorax). It can develop suddenly on its own (spontaneous pneumothorax) or from trauma to the chest.  Shingles infection (varicella-zoster virus).  Heart attack.  Damage to the bones, muscles, and cartilage that make up your chest wall. This can include:  Bruised bones due to injury.  Strained muscles or cartilage due to frequent or repeated coughing or overwork.  Fracture to one or more ribs.  Sore cartilage due to inflammation (costochondritis). RISK FACTORS  Risk factors for chest pain may include:  Activities that increase your risk for trauma or injury to your chest.  Respiratory infections or conditions that cause frequent coughing.  Medical conditions or overeating that can cause heartburn.  Heart disease or family history of heart disease.  Conditions or health behaviors that increase your risk of developing a blood clot.  Having had chicken pox (varicella zoster). SIGNS AND SYMPTOMS Chest pain can feel like:  Burning or tingling on the surface of your chest or deep in your  chest.  Crushing, pressure, aching, or squeezing pain.  Dull or sharp pain that is worse when you move, cough, or take a deep breath.  Pain that is also felt in your back, neck, shoulder, or arm, or pain that spreads to any of these areas. Your chest pain may come and go, or it may stay constant. DIAGNOSIS Lab tests or other studies may be needed to find the cause of your pain. Your health care provider may have you take a test called an ambulatory ECG (electrocardiogram). An ECG records your heartbeat patterns at the time the test is performed. You may also have other tests, such as:  Transthoracic echocardiogram (TTE). During echocardiography, sound waves are used to create a picture of all of the heart structures and to look at how blood flows through your heart.  Transesophageal echocardiogram (TEE).This is a more advanced imaging test that obtains images from inside your body. It allows your health care provider to see your heart in finer detail.  Cardiac monitoring. This allows your health care provider to monitor your heart rate and rhythm in real time.  Holter monitor. This is a portable device that records your heartbeat and can help to diagnose abnormal heartbeats. It allows your health care provider to track your heart activity for several days, if needed.  Stress tests. These can be done through exercise or by taking medicine that makes your heart beat more quickly.  Blood tests.  Imaging tests. TREATMENT  Your treatment depends on what is causing your chest pain. Treatment may include:  Medicines. These may include:  Acid blockers for heartburn.  Anti-inflammatory medicine.  Pain medicine for inflammatory conditions.  Antibiotic medicine, if an infection is present.  Medicines to dissolve blood clots.  Medicines to treat coronary artery disease.  Supportive care for conditions that do not require medicines. This may include:  Resting.  Applying heat or cold  packs to injured areas.  Limiting activities until pain decreases. HOME CARE INSTRUCTIONS  If you were prescribed an antibiotic medicine, finish it all even if you start to feel better.  Avoid any activities that bring on chest pain.  Do not use any tobacco products, including cigarettes, chewing tobacco, or electronic cigarettes. If you need help quitting, ask your health care provider.  Do not drink alcohol.  Take medicines only as directed by your health care provider.  Keep all follow-up visits as directed by your health care provider. This is important. This includes any further testing if your chest pain does not go away.  If heartburn is the cause for your chest pain, you may be told to keep your head raised (elevated) while sleeping. This reduces the chance that acid will go from your stomach into your esophagus.  Make lifestyle changes as directed by your health care provider. These may include:  Getting regular exercise. Ask your health care provider to suggest some activities that are safe for you.  Eating a heart-healthy diet. A registered dietitian can help you to learn healthy eating options.  Maintaining a healthy weight.  Managing diabetes, if necessary.  Reducing stress. SEEK MEDICAL CARE IF:  Your chest pain does not go away after treatment.  You have a rash with blisters on your chest.  You have a fever. SEEK IMMEDIATE MEDICAL CARE IF:   Your chest pain is worse.  You have an increasing cough, or you cough up blood.  You have severe abdominal pain.  You have severe weakness.  You faint.  You have chills.  You have sudden, unexplained chest discomfort.  You have sudden, unexplained discomfort in your arms, back, neck, or jaw.  You have shortness of breath at any time.  You suddenly start to sweat, or your skin gets clammy.  You feel nauseous or you vomit.  You suddenly feel light-headed or dizzy.  Your heart begins to beat quickly, or  it feels like it is skipping beats. These symptoms may represent a serious problem that is an emergency. Do not wait to see if the symptoms will go away. Get medical help right away. Call your local emergency services (911 in the [place] Do not drive yourself to the hospital.   This information is not intended to replace advice given to you by your health care provider. Make sure you discuss any questions you have with your health care provider.   Document Released: 09/14/2005 Document Revised: 12/26/2014 Document Reviewed: 07/11/2014 Elsevier Interactive Patient Education [employer].

## 2015-11-02 ENCOUNTER — Telehealth: Payer: Self-pay | Admitting: Interventional Cardiology

## 2015-11-02 DIAGNOSIS — R1013 Epigastric pain: Secondary | ICD-10-CM

## 2015-11-02 DIAGNOSIS — R0789 Other chest pain: Secondary | ICD-10-CM

## 2015-11-02 NOTE — Telephone Encounter (Signed)
Please set this patient up for a stress echo and then a f/u visit with me.  Diagnosis is chest/epigastric pain.  Thanks.  JV

## 2015-11-02 NOTE — Telephone Encounter (Signed)
I called the pt and went over her Stress Echo instructions. She repeated instructions back to me and verbalized understanding. She is aware that someone from our scheduling team will call to arrange date and time for test.  Stress Echo ordered [facility].

## 2015-11-03 ENCOUNTER — Telehealth (HOSPITAL_COMMUNITY): Payer: Self-pay | Admitting: *Deleted

## 2015-11-25 ENCOUNTER — Ambulatory Visit (HOSPITAL_COMMUNITY): Payer: 59 | Attending: Cardiovascular Disease

## 2015-11-25 ENCOUNTER — Ambulatory Visit (HOSPITAL_BASED_OUTPATIENT_CLINIC_OR_DEPARTMENT_OTHER): Payer: 59

## 2015-11-25 DIAGNOSIS — R1013 Epigastric pain: Secondary | ICD-10-CM

## 2015-11-25 DIAGNOSIS — R0789 Other chest pain: Secondary | ICD-10-CM | POA: Diagnosis not present

## 2015-11-25 DIAGNOSIS — R079 Chest pain, unspecified: Secondary | ICD-10-CM | POA: Diagnosis present

## 2015-11-25 DIAGNOSIS — I10 Essential (primary) hypertension: Secondary | ICD-10-CM | POA: Insufficient documentation

## 2015-11-25 DIAGNOSIS — R0989 Other specified symptoms and signs involving the circulatory and respiratory systems: Secondary | ICD-10-CM

## 2015-11-26 LAB — ECHOCARDIOGRAM STRESS TEST
CHL CUP STRESS STAGE 1 GRADE: 0 %
CHL CUP STRESS STAGE 1 SPEED: 0 mph
CHL CUP STRESS STAGE 2 DBP: 83 mmHg
CHL CUP STRESS STAGE 2 GRADE: 0 %
CHL CUP STRESS STAGE 2 HR: 71 {beats}/min
CHL CUP STRESS STAGE 3 HR: 71 {beats}/min
CHL CUP STRESS STAGE 4 SPEED: 1.7 mph
CHL CUP STRESS STAGE 5 DBP: 72 mmHg
CHL CUP STRESS STAGE 5 HR: 139 {beats}/min
CHL CUP STRESS STAGE 5 SBP: 214 mmHg
CHL CUP STRESS STAGE 7 DBP: 70 mmHg
CHL CUP STRESS STAGE 7 HR: 121 {beats}/min
CHL CUP STRESS STAGE 7 SBP: 223 mmHg
CHL CUP STRESS STAGE 8 DBP: 67 mmHg
CSEPPHR: 155 {beats}/min
CSEPPMHR: 91 %
Estimated workload: 2.7 METS
Stage 1 DBP: 83 mmHg
Stage 1 HR: 73 {beats}/min
Stage 1 SBP: 139 mmHg
Stage 2 SBP: 140 mmHg
Stage 2 Speed: 0 mph
Stage 3 Grade: 0 %
Stage 3 Speed: 0 mph
Stage 4 Grade: 10 %
Stage 4 HR: 108 {beats}/min
Stage 5 Grade: 12 %
Stage 5 Speed: 2.5 mph
Stage 6 HR: 155 {beats}/min
Stage 8 Grade: 0 %
Stage 8 HR: 96 {beats}/min
Stage 8 SBP: 171 mmHg
Stage 8 Speed: 0 mph

## 2016-09-13 DIAGNOSIS — I10 Essential (primary) hypertension: Secondary | ICD-10-CM | POA: Diagnosis not present

## 2016-09-19 DIAGNOSIS — H35423 Microcystoid degeneration of retina, bilateral: Secondary | ICD-10-CM | POA: Diagnosis not present

## 2016-09-19 DIAGNOSIS — H35031 Hypertensive retinopathy, right eye: Secondary | ICD-10-CM | POA: Diagnosis not present

## 2016-09-19 DIAGNOSIS — H43812 Vitreous degeneration, left eye: Secondary | ICD-10-CM | POA: Diagnosis not present

## 2016-09-19 DIAGNOSIS — H35433 Paving stone degeneration of retina, bilateral: Secondary | ICD-10-CM | POA: Diagnosis not present

## 2016-09-20 DIAGNOSIS — H5712 Ocular pain, left eye: Secondary | ICD-10-CM | POA: Diagnosis not present

## 2016-09-21 DIAGNOSIS — H04123 Dry eye syndrome of bilateral lacrimal glands: Secondary | ICD-10-CM | POA: Diagnosis not present

## 2016-09-23 ENCOUNTER — Other Ambulatory Visit (HOSPITAL_COMMUNITY): Payer: Self-pay | Admitting: Ophthalmology

## 2016-09-23 DIAGNOSIS — H469 Unspecified optic neuritis: Secondary | ICD-10-CM

## 2016-09-23 DIAGNOSIS — H43812 Vitreous degeneration, left eye: Secondary | ICD-10-CM | POA: Diagnosis not present

## 2016-09-26 ENCOUNTER — Encounter (HOSPITAL_COMMUNITY): Payer: Self-pay

## 2016-09-26 ENCOUNTER — Emergency Department (HOSPITAL_COMMUNITY)
Admission: EM | Admit: 2016-09-26 | Discharge: 2016-09-27 | Disposition: A | Discharge status: Home / Self Care | Payer: BLUE CROSS/BLUE SHIELD | Attending: Emergency Medicine | Admitting: Emergency Medicine

## 2016-09-26 DIAGNOSIS — H469 Unspecified optic neuritis: Secondary | ICD-10-CM

## 2016-09-26 DIAGNOSIS — R9082 White matter disease, unspecified: Secondary | ICD-10-CM | POA: Diagnosis not present

## 2016-09-26 DIAGNOSIS — H538 Other visual disturbances: Secondary | ICD-10-CM | POA: Diagnosis present

## 2016-09-26 DIAGNOSIS — Z79899 Other long term (current) drug therapy: Secondary | ICD-10-CM | POA: Insufficient documentation

## 2016-09-26 DIAGNOSIS — I10 Essential (primary) hypertension: Secondary | ICD-10-CM | POA: Diagnosis not present

## 2016-09-26 NOTE — ED Triage Notes (Signed)
Pt complaining of vision problems x 1 week. Pt states eye MD found swelling of optic nerve. Pt complaining of obscured vision of lower left eye. Pt denies any pain or trauma to head.

## 2016-09-26 NOTE — ED Notes (Signed)
MD providing referral to ED - Dr "Franky MachoHuddard"

## 2016-09-26 NOTE — ED Notes (Signed)
Pt usually wears glasses, but did not bring them with her to the ED. Pt performed visual acuity test without.

## 2016-09-26 NOTE — ED Provider Notes (Signed)
[facility]-EMERGENCY DEPT Provider Note   CSN: [id number] Arrival date & time: 09/26/16  4098  By signing my name below, I, Rosario Adie, attest that this documentation has been prepared under the direction and in the presence of Tomasita Crumble, MD. Electronically Signed: Rosario Adie, ED Scribe. 09/26/16. 11:30 PM.  History   Chief Complaint Chief Complaint  Patient presents with  . Loss of Vision   The history is provided by the patient. No language interpreter was used.   HPI Comments: Hannah Vega is a 50 y.o. female with a PMHx of HTN, who presents to the Emergency Department complaining of constant, unchanged partially blurred vision to her left eye vision onset ~1.5-2 weeks ago. Pt reports that two weeks ago prior to the onset of her blurred vision that she noticed a sharp pain between her eyes, and several days following she was experiencing flu-like symptoms and noticed a new onset of partial blurry vision to her left eye. She was seen for same by her Optometrist, PCP, and Retinologist for this issue since onset with mostly unremarkable workups. However, she notes that she with her most recent visit to her Optometrist they found swelling to her optic nerve of the left eye. 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 No hx of similar symptoms. No FHx of autoimmune disease. Denies joint swelling, extremity pain, focal weakness, photophobia, or any other associated symptoms.   PCP: Lenora Boys, MD  Past Medical History:  Diagnosis Date  . Eclampsia in delivered patient 01/30/2013  . Female infertility   . Hand dermatitis   . Hypertension    Patient Active Problem List   Diagnosis Date Noted  . Pericardial effusion 02/03/2013  . Eclampsia in delivered patient 01/30/2013  . PRES (posterior reversible encephalopathy syndrome) 01/30/2013  . Other convulsions  01/30/2013   Past Surgical History:  Procedure Laterality Date  . CESAREAN SECTION N/A 01/29/2013   Procedure: CESAREAN SECTION  twins 35 weeks;  Surgeon: Lenoard Aden, MD;  Location: [facility];  Service: Obstetrics;  Laterality: N/A;  Primary C/S   EDD: 03/05/13  . TONSILLECTOMY    . TYMPANOSTOMY TUBE PLACEMENT     OB History    Gravida Para Term Preterm AB Living   1 1   1   2    SAB TAB Ectopic Multiple Live Births         1 2     Home Medications    Prior to Admission medications   Medication Sig Start Date End Date Taking? Authorizing Provider  Calcium 500 MG CHEW Chew 2 tablets by mouth daily.   Yes Historical Provider, MD  ibuprofen (ADVIL,MOTRIN) 200 MG tablet Take 400 mg by mouth every 6 (six) hours as needed (for eye pain).   Yes Historical Provider, MD  Multiple Vitamins-Minerals (ONE-A-DAY WOMENS 50+ ADVANTAGE) TABS Take 1 tablet by mouth daily.   Yes Historical Provider, MD  triamterene-hydrochlorothiazide (MAXZIDE-25) 37.5-25 MG tablet Take 0.5 tablets by mouth daily. 08/30/16  Yes Historical Provider, MD  valsartan (DIOVAN) 80 MG tablet Take 80 mg by mouth daily. 09/06/16  Yes Historical Provider, MD  doxycycline (VIBRAMYCIN) 100 MG capsule Take 100 mg by mouth 2 (two) times daily. For 10 days 09/21/16   Historical Provider, MD  LOTEMAX 0.5 % GEL Place 1 drop into the left eye 4 (four) times daily. AS DIRECTED  09/21/16   Historical Provider, MD   Family History Family History  Problem Relation Age of Onset  . Hypertension Mother   . Heart attack Father   . Heart disease Father   . Hypertension Father   . Hypertension Sister   . Hyperlipidemia Brother   . Hypertension Brother    Social History Social History  Substance Use Topics  . Smoking status: Never Smoker  . Smokeless tobacco: Never Used  . Alcohol use Yes     Comment: rare   Allergies   Augmentin [amoxicillin-pot clavulanate]; Codeine; Darvon [propoxyphene]; Erythromycin; and Phenergan [promethazine  hcl]  Review of Systems Review of Systems A complete 10 system review of systems was obtained and all systems are negative except as noted in the HPI and PMH.   Physical Exam Updated Vital Signs BP 141/60   Pulse 64   Temp 98.2 F (36.8 C) (Oral)   Resp 16   SpO2 97%   Physical Exam  Constitutional: She is oriented to person, place, and time. She appears well-developed and well-nourished. No distress.  HENT:  Head: Normocephalic and atraumatic.  Nose: Nose normal.  Mouth/Throat: Oropharynx is clear and moist. No oropharyngeal exudate.  Eyes: Conjunctivae and EOM are normal. Pupils are equal, round, and reactive to light. No scleral icterus.  20/25 vision bilaterally.   Neck: Normal range of motion. Neck supple. No JVD present. No tracheal deviation present. No thyromegaly present.  Cardiovascular: Normal rate, regular rhythm and normal heart sounds.  Exam reveals no gallop and no friction rub.   No murmur heard. Pulmonary/Chest: Effort normal and breath sounds normal. No respiratory distress. She has no wheezes. She exhibits no tenderness.  Abdominal: Soft. Bowel sounds are normal. She exhibits no distension and no mass. There is no tenderness. There is no rebound and no guarding.  Musculoskeletal: Normal range of motion. She exhibits no edema or tenderness.  Lymphadenopathy:    She has no cervical adenopathy.  Neurological: She is alert and oriented to person, place, and time. No cranial nerve deficit. She exhibits normal muscle tone.  Skin: Skin is warm and dry. No rash noted. No erythema. No pallor.  Nursing note and vitals reviewed.  ED Treatments / Results  DIAGNOSTIC STUDIES: Oxygen Saturation is 99% on RA, normal by my interpretation.   COORDINATION OF CARE: 11:22 PM-Discussed next steps with pt. Pt verbalized understanding and is agreeable with the plan.   Labs (all labs ordered are listed, but only abnormal results are displayed) Labs Reviewed  CBC WITH  DIFFERENTIAL/PLATELET  BASIC METABOLIC PANEL    EKG  EKG Interpretation None      Radiology No results found.  Procedures Procedures   Medications Ordered in ED Medications - No data to display  Initial Impression / Assessment and Plan / ED Course  I have reviewed the triage vital signs and the nursing notes.  Pertinent labs & imaging results that were available during my care of the patient were reviewed by me and considered in my medical decision making (see chart for details).  Clinical Course   Patient presents to the ED for blurry vision and an MRI showing optic neuritis.  Will consult with neurology regarding admission for IV steroids.  She is still currently symptomatic with pain and pressure behind the L eye.    1:17 AM I spoke with Dr. Roxy Manns who states IV steroids are not indicated greater than 2 weeks out from symptoms.  Patient already given solumedrol prior to consultation.  Will  DC home with 7 days of prednisone as instructed by neurology and provide neurology follow up.  Patient is amendable with plan. She appears well and in NAD. VS remain within her normal limits and she is safe for DC.  Final Clinical Impressions(s) / ED Diagnoses   Final diagnoses:  None   New Prescriptions New Prescriptions   No medications on file   I personally performed the services described in this documentation, which was scribed in my presence. The recorded information has been reviewed and is accurate.       Tomasita CrumbleAdeleke Willa Brocks, MD 09/27/16 [phone]

## 2016-09-27 LAB — BASIC METABOLIC PANEL
Anion gap: 10 (ref 5–15)
BUN: 9 mg/dL (ref 6–20)
CO2: 23 mmol/L (ref 22–32)
Calcium: 9.2 mg/dL (ref 8.9–10.3)
Chloride: 107 mmol/L (ref 101–111)
Creatinine, Ser: 0.86 mg/dL (ref 0.44–1.00)
Glucose, Bld: 93 mg/dL (ref 65–99)
POTASSIUM: 4 mmol/L (ref 3.5–5.1)
SODIUM: 140 mmol/L (ref 135–145)

## 2016-09-27 LAB — CBC WITH DIFFERENTIAL/PLATELET
BASOS ABS: 0 10*3/uL (ref 0.0–0.1)
BASOS PCT: 0 %
EOS ABS: 0.4 10*3/uL (ref 0.0–0.7)
EOS PCT: 5 %
HCT: 40.3 % (ref 36.0–46.0)
Hemoglobin: 13.4 g/dL (ref 12.0–15.0)
LYMPHS ABS: 2.5 10*3/uL (ref 0.7–4.0)
Lymphocytes Relative: 36 %
MCH: 29.5 pg (ref 26.0–34.0)
MCHC: 33.3 g/dL (ref 30.0–36.0)
MCV: 88.8 fL (ref 78.0–100.0)
Monocytes Absolute: 0.4 10*3/uL (ref 0.1–1.0)
Monocytes Relative: 5 %
NEUTROS PCT: 54 %
Neutro Abs: 3.8 10*3/uL (ref 1.7–7.7)
PLATELETS: 213 10*3/uL (ref 150–400)
RBC: 4.54 MIL/uL (ref 3.87–5.11)
RDW: 12.7 % (ref 11.5–15.5)
WBC: 7.1 10*3/uL (ref 4.0–10.5)

## 2016-09-27 MED ORDER — SODIUM CHLORIDE 0.9 % IV SOLN
1000.0000 mg | Freq: Once | INTRAVENOUS | Status: AC
  Administered 2016-09-27: 1000 mg via INTRAVENOUS
  Filled 2016-09-27: qty 8

## 2016-09-27 MED ORDER — METHYLPREDNISOLONE SODIUM SUCC 125 MG IJ SOLR
INTRAMUSCULAR | Status: AC
  Filled 2016-09-27: qty 2

## 2016-09-27 MED ORDER — PREDNISONE 20 MG PO TABS: 60.0000 mg | ORAL_TABLET | Freq: Every day | ORAL | 0 refills | Status: DC

## 2016-09-27 NOTE — Discharge Instructions (Signed)
Ms. Lucretia RoersWood, take prednisone daily for 1 week and see neurology within 3 days for close follow up.  If any symptoms worsen, come back to the ED immediately. Thank you.  Optic neuritis (ON) is inflammation of the optic nerve. Classically there is a triad of clinical features - reduced vision (of varying severity), eye pain (particularly on movement) and impaired colour vision.  The term 'optic neuritis' means inflammatory optic neuropathy from any cause, but is sometimes used to refer to acute demyelinating optic neuritis. In this article, 'optic neuritis' (ON) refers to optic neuritis of any type, and 'acute demyelinating optic neuritis' (ADON) will be used for that specific form. Other terms used in the literature are papillitis (if the optic nerve head is affected) and retrobulbar neuritis (if the nerve is affected more posteriorly).  ADON is a common cause of ON in parts of the world where multiple sclerosis (MS) is common. However, there are many other possible causes which must not be overlooked, as they may require different and urgent management.  Neuromyelitis optica (NMO), also known as Devic's disease or Devic's syndrome, is a rare condition in which there are recurrent and simultaneous optic neuritis and myelitis of the spinal cord. Lesions are different from those observed in MS, and the condition requires a different course of treatment.[1, 2]  The first part of this article discusses ON of any type: its causes, assessment and diagnosis. The second part gives more detail about ADON in adults, with a note on Devic's disease.

## 2016-09-27 NOTE — ED Notes (Signed)
Pt stable, ambulatory, states understanding of discharge instructions 

## 2016-09-29 ENCOUNTER — Other Ambulatory Visit (INDEPENDENT_AMBULATORY_CARE_PROVIDER_SITE_OTHER): Payer: BLUE CROSS/BLUE SHIELD

## 2016-09-29 ENCOUNTER — Encounter: Payer: Self-pay | Admitting: Neurology

## 2016-09-29 ENCOUNTER — Ambulatory Visit (INDEPENDENT_AMBULATORY_CARE_PROVIDER_SITE_OTHER): Payer: BLUE CROSS/BLUE SHIELD | Admitting: Neurology

## 2016-09-29 VITALS — BP 146/72 | HR 112 | Temp 97.9°F | Ht 63.0 in | Wt 162.0 lb

## 2016-09-29 DIAGNOSIS — H469 Unspecified optic neuritis: Secondary | ICD-10-CM

## 2016-09-29 LAB — VITAMIN D 25 HYDROXY (VIT D DEFICIENCY, FRACTURES): VITD: 23.55 ng/mL — AB (ref 30.00–100.00)

## 2016-09-29 NOTE — Progress Notes (Signed)
NEUROLOGY CONSULTATION NOTE  Hannah Vega MRN: [id number] DOB: 17-Sep-1966  Referring provider: Dr. Arlys JohnNathan Haines Primary care provider: Lovenia KimMark Hepler, PA-C  Reason for consult:  Optic neuritis  Dear Dr Lita MainsHaines:  Thank you for your kind referral of Hannah Vega for consultation of the above symptoms. Although her history is well known to you, please allow me to reiterate it for the purpose of our medical record. The patient was accompanied to the clinic by her husband who also provides collateral information. Records and images were personally reviewed where available.  HISTORY OF PRESENT ILLNESS: This is a pleasant 50 year old right-handed woman with a history of hypertension, in her usual state of health until 2 weeks ago when she had a sore spot between her left brow and eye. The next day she was aching in her whole body with a slight fever and felt horrible for a couple of days. On Saturday, she started noticing "fuzzy vision" on the bottom of her left eye that lasted until the next day. On Monday, blurred vision worsened in the corner of her left eye with a pressure sensation. She saw a retinal specialist with a normal exam per patient. No report of papilledema. She saw her PCP the day after and was diagnosed with a sinus infection and treated with doxycycline as it was felt her left eardrum was tight. Her vision continued to bother her and she went back twice to the eye doctor, repeat Humphrey Matrix test done 09/23/16 showed a left inferior altitudinal defect. Her vision continued to become more blurred. She had an MRI brain and orbits with and without contrast on 09/26/16 which I personally reviewed, there is abnormal signal and enhancement in the posterior intraorbital and intracanalicular segments of the left optic nerve. MRI brain showed scattered FLAIR abnormalities, a couple were radially-oriented, no enhancement seen. She went to the ER and was discharged home on Prednisone 60mg  daily with  follow-up with Neurology. She reports that she has noticed some improvement in vision, but still has the "fuzzy spot" on the left canthal region. She is having a headache today but denies any further eye pain.  She denies any prior similar symptoms in the past, except for an episode of diplopia 12 years ago affecting her left eye. 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 She had an MRI brain at that time reported as normal. 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 She denies any focal numbness/tingling/weakness, she has dizziness when her BP spikes and had some difficulties regulating BP for a few months. She denies any dysarthria/dysphagia, neck/back pain, bowel/bladder dysfunction. No family history of similar symptoms. She denies any recent travels, infections, or immunizations.   Laboratory Data: Lab Results  Component Value Date   WBC 7.1 09/27/2016   HGB 13.4 09/27/2016   HCT 40.3 09/27/2016   MCV 88.8 09/27/2016   PLT 213 09/27/2016     Chemistry      Component Value Date/Time   NA 140 09/27/2016 0022   K 4.0 09/27/2016 0022   CL 107 09/27/2016 0022   CO2 23 09/27/2016 0022   BUN 9 09/27/2016 0022   CREATININE 0.86 09/27/2016 0022      Component Value Date/Time   CALCIUM 9.2 09/27/2016 0022   ALKPHOS 144 (H) 02/04/2013 0530   AST 33 02/04/2013 0530   ALT 47 (H) 02/04/2013 0530   BILITOT 0.3 02/04/2013 0530  PAST MEDICAL HISTORY: Past Medical History:  Diagnosis Date  . Eclampsia in delivered patient 01/30/2013  . Female infertility   . Hand dermatitis   . Hypertension     PAST SURGICAL HISTORY: Past Surgical History:  Procedure Laterality Date  . CESAREAN SECTION N/A 01/29/2013   Procedure: CESAREAN SECTION  twins 35 weeks;  Surgeon: Lenoard Aden, MD;  Location: [facility];  Service: Obstetrics;  Laterality: N/A;  Primary C/S   EDD: 03/05/13  . TONSILLECTOMY    . TYMPANOSTOMY TUBE  PLACEMENT      MEDICATIONS: Current Outpatient Prescriptions on File Prior to Visit  Medication Sig Dispense Refill  . Calcium 500 MG CHEW Chew 2 tablets by mouth daily.    Marland Kitchen ibuprofen (ADVIL,MOTRIN) 200 MG tablet Take 400 mg by mouth every 6 (six) hours as needed (for eye pain).    . Multiple Vitamins-Minerals (ONE-A-DAY WOMENS 50+ ADVANTAGE) TABS Take 1 tablet by mouth daily.    . predniSONE (DELTASONE) 20 MG tablet Take 3 tablets (60 mg total) by mouth daily. 21 tablet 0  . triamterene-hydrochlorothiazide (MAXZIDE-25) 37.5-25 MG tablet Take 0.5 tablets by mouth daily.  1  . valsartan (DIOVAN) 80 MG tablet Take 80 mg by mouth daily.  2  . doxycycline (VIBRAMYCIN) 100 MG capsule Take 100 mg by mouth 2 (two) times daily. For 10 days  0  . LOTEMAX 0.5 % GEL Place 1 drop into the left eye 4 (four) times daily. AS DIRECTED  0   No current facility-administered medications on file prior to visit.     ALLERGIES: Allergies  Allergen Reactions  . Augmentin [Amoxicillin-Pot Clavulanate] Nausea And Vomiting  . Codeine Nausea And Vomiting  . Darvon [Propoxyphene] Nausea And Vomiting  . Erythromycin Nausea And Vomiting  . Phenergan [Promethazine Hcl] Other (See Comments)    Pain and burning at injection site when given IM    FAMILY HISTORY: Family History  Problem Relation Age of Onset  . Hypertension Mother   . Heart attack Father   . Heart disease Father   . Hypertension Father   . Hypertension Sister   . Hyperlipidemia Brother   . Hypertension Brother     SOCIAL HISTORY: Social History   Social History  . Marital status: Married    Spouse name: N/A  . Number of children: N/A  . Years of education: N/A   Occupational History  . Not on file.   Social History Main Topics  . Smoking status: Never Smoker  . Smokeless tobacco: Never Used  . Alcohol use Yes     Comment: rare  . Drug use: No  . Sexual activity: Yes    Birth control/ protection: None   Other Topics  Concern  . Not on file   Social History Narrative  . No narrative on file    REVIEW OF SYSTEMS: Constitutional: No fevers, chills, or sweats, no generalized fatigue, change in appetite Eyes: No visual changes, double vision, eye pain Ear, nose and throat: No hearing loss, ear pain, nasal congestion, sore throat Cardiovascular: No chest pain, palpitations Respiratory:  No shortness of breath at rest or with exertion, wheezes GastrointestinaI: No nausea, vomiting, diarrhea, abdominal pain, fecal incontinence Genitourinary:  No dysuria, urinary retention or frequency Musculoskeletal:  No neck pain, back pain Integumentary: No rash, pruritus, skin lesions Neurological: as above Psychiatric: No depression, insomnia, anxiety Endocrine: No palpitations, fatigue, diaphoresis, mood swings, change in appetite, change in weight, increased thirst Hematologic/Lymphatic:  No anemia, purpura, petechiae. Allergic/Immunologic:  no itchy/runny eyes, nasal congestion, recent allergic reactions, rashes  PHYSICAL EXAM: Vitals:   09/29/16 1318  BP: (!) 146/72  Pulse: (!) 112  Temp: 97.9 F (36.6 C)   General: No acute distress Head:  Normocephalic/atraumatic Eyes: Fundoscopic exam shows bilateral sharp discs, no vessel changes, exudates, or hemorrhages Neck: supple, no paraspinal tenderness, full range of motion Back: No paraspinal tenderness Heart: regular rate and rhythm Lungs: Clear to auscultation bilaterally. Vascular: No carotid bruits. Skin/Extremities: No rash, no edema Neurological Exam: Mental status: alert and oriented to person, place, and time, no dysarthria or aphasia, Fund of knowledge is appropriate.  Recent and remote memory are intact.  Attention and concentration are normal.    Able to name objects and repeat phrases. Cranial nerves: CN I: not tested CN II: pupils equal, round and reactive to light, visual fields intact, fundi unremarkable. CN III, IV, VI:  full range of  motion, no nystagmus, no ptosis CN V: facial sensation intact CN VII: upper and lower face symmetric CN VIII: hearing intact to finger rub CN IX, X: gag intact, uvula midline CN XI: sternocleidomastoid and trapezius muscles intact CN XII: tongue midline Bulk & Tone: normal, no fasciculations. Motor: 5/5 throughout with no pronator drift. Sensation: intact to light touch, cold, pin, vibration and joint position sense.  No extinction to double simultaneous stimulation.  Romberg test negative Deep Tendon Reflexes: +2 throughout, no ankle clonus Plantar responses: downgoing bilaterally Cerebellar: no incoordination on finger to nose, heel to shin. No dysdiadochokinesia Gait: narrow-based and steady, able to tandem walk adequately. Tremor: none  IMPRESSION: This is a pleasant 50 year old right-handed woman with a history of hypertension, presenting with left optic neuritis. Symptoms started 2 weeks ago, initial eye exam was normal, however repeat Humphrey Matrix done 09/23/16 showed a left inferior altitudinal defect. Her MRI brain shows scattered white matter changes that are non-specific, no callosal lesions. There are 2 that are radially-oriented, no abnormal enhancement. These changes could be seen due to chronic microvascular disease from hypertension. Her left optic nerve shows abnormal signal and enhancement, consistent with optic neuritis. We discussed diagnosis and prognosis, concerns for MS, however in this age group and with equivocal MRI brain, this is less likely. MRI cervical and thoracic spine with and without contrast will be ordered to assess for other lesions disseminated in space. We discussed doing a spinal tap to assess for oligoclonal bands, she is very anxious and refuses an LP at this time. We discussed doing MRI spine first and proceeding from there. Check vitamin D level. She was started on PO Prednisone in the ER, however the ONTT study has shown that oral prednisone does not  affect visual outcomes and may be associated with an increased risk for recurrent optic neuritis. Since she has 2 or more white matter lesions on MRI, IV Solumedrol is recommended, she will be scheduled for a 3-day course and was given a quick taper off the oral Prednisone. She will follow-up in 3 months and knows to call for any changes.   Thank you for allowing me to participate in the care of this patient. Please do not hesitate to call for any questions or concerns.   Patrcia Dolly, M.D.  CC: Dr. Lita Mains, Dr. Mora Bellman

## 2016-09-29 NOTE — Patient Instructions (Signed)
1. Schedule MRI cervical spine with and without contrast 2. Schedule MRI thoracic spine with and without contrast 3. Schedule 3-day IV Solumedrol 1000mg   4. Start tapering Prednisone 20mg  by 1/2 tablet every day (2.5 tabs on day 1, 2 tabs on day 2, 1.5 tabs on day 3, 1 tab on day 4, 1/2 tab on day, then stop) 5. Follow-up in 3 months, call for any changes in symptoms

## 2016-09-30 ENCOUNTER — Telehealth: Payer: Self-pay

## 2016-09-30 ENCOUNTER — Other Ambulatory Visit: Payer: Self-pay

## 2016-09-30 ENCOUNTER — Encounter: Payer: Self-pay | Admitting: Neurology

## 2016-09-30 DIAGNOSIS — H469 Unspecified optic neuritis: Secondary | ICD-10-CM | POA: Insufficient documentation

## 2016-09-30 MED ORDER — VITAMIN D (ERGOCALCIFEROL) 1.25 MG (50000 UNIT) PO CAPS: 50000.0000 [IU] | ORAL_CAPSULE | ORAL | 0 refills | Status: DC

## 2016-09-30 NOTE — Telephone Encounter (Signed)
PT called and has a medication question/Dawn CB# [phone]

## 2016-09-30 NOTE — Telephone Encounter (Signed)
Patient made aware of below

## 2016-09-30 NOTE — Telephone Encounter (Signed)
LVM for patient to return my call regarding Solumedrol IV scheduled at Short Stay. First appointment scheduled for 10/18 at 9:00AM.

## 2016-10-04 ENCOUNTER — Other Ambulatory Visit: Payer: Self-pay | Admitting: *Deleted

## 2016-10-04 DIAGNOSIS — H469 Unspecified optic neuritis: Secondary | ICD-10-CM

## 2016-10-04 MED ORDER — SODIUM CHLORIDE 0.9 % IV SOLN: 1000.0000 mg | Freq: Every day | INTRAVENOUS | Status: AC

## 2016-10-05 ENCOUNTER — Ambulatory Visit (HOSPITAL_COMMUNITY)
Admission: RE | Admit: 2016-10-05 | Discharge: 2016-10-05 | Disposition: A | Discharge status: Home / Self Care | Payer: BLUE CROSS/BLUE SHIELD | Source: Ambulatory Visit | Attending: Neurology | Admitting: Neurology

## 2016-10-05 DIAGNOSIS — H469 Unspecified optic neuritis: Secondary | ICD-10-CM

## 2016-10-05 MED ORDER — METHYLPREDNISOLONE SODIUM SUCC 1000 MG IJ SOLR
1000.0000 mg | Freq: Every day | INTRAMUSCULAR | Status: DC
  Filled 2016-10-05: qty 8

## 2016-10-05 MED ORDER — SODIUM CHLORIDE 0.9 % IV SOLN
1000.0000 mg | Freq: Once | INTRAVENOUS | Status: AC
  Administered 2016-10-05: 1000 mg via INTRAVENOUS
  Filled 2016-10-05: qty 8

## 2016-10-06 ENCOUNTER — Other Ambulatory Visit (HOSPITAL_COMMUNITY): Payer: Self-pay | Admitting: *Deleted

## 2016-10-06 ENCOUNTER — Ambulatory Visit (HOSPITAL_COMMUNITY)
Admission: RE | Admit: 2016-10-06 | Discharge: 2016-10-06 | Disposition: A | Discharge status: Home / Self Care | Payer: BLUE CROSS/BLUE SHIELD | Source: Ambulatory Visit | Attending: Neurology | Admitting: Neurology

## 2016-10-06 DIAGNOSIS — M47812 Spondylosis without myelopathy or radiculopathy, cervical region: Secondary | ICD-10-CM | POA: Diagnosis not present

## 2016-10-06 DIAGNOSIS — H469 Unspecified optic neuritis: Secondary | ICD-10-CM | POA: Diagnosis not present

## 2016-10-06 MED ORDER — SODIUM CHLORIDE 0.9 % IV SOLN
1000.0000 mg | Freq: Once | INTRAVENOUS | Status: AC
  Administered 2016-10-06: 1000 mg via INTRAVENOUS
  Filled 2016-10-06: qty 8

## 2016-10-06 MED ORDER — METHYLPREDNISOLONE SODIUM SUCC 1000 MG IJ SOLR: 1000.0000 mg | Freq: Every day | INTRAMUSCULAR | Status: DC

## 2016-10-07 ENCOUNTER — Ambulatory Visit (HOSPITAL_COMMUNITY)
Admission: RE | Admit: 2016-10-07 | Discharge: 2016-10-07 | Disposition: A | Discharge status: Home / Self Care | Payer: BLUE CROSS/BLUE SHIELD | Source: Ambulatory Visit | Attending: Neurology | Admitting: Neurology

## 2016-10-07 ENCOUNTER — Telehealth: Payer: Self-pay | Admitting: Neurology

## 2016-10-07 DIAGNOSIS — H469 Unspecified optic neuritis: Secondary | ICD-10-CM | POA: Diagnosis not present

## 2016-10-07 MED ORDER — METHYLPREDNISOLONE SODIUM SUCC 1000 MG IJ SOLR
1000.0000 mg | Freq: Once | INTRAMUSCULAR | Status: AC
  Administered 2016-10-07: 1000 mg via INTRAVENOUS
  Filled 2016-10-07: qty 8

## 2016-10-07 NOTE — Telephone Encounter (Signed)
PT called regarding MRI results/Dawn CB# [phone]

## 2016-10-07 NOTE — Telephone Encounter (Signed)
Discussed MRI cervical and thoracic spine, looks normal. Formal report pending.

## 2016-10-07 NOTE — Telephone Encounter (Signed)
Contacted [facility].  After they have results which can take 24/48hrs they are going to fax to us.

## 2016-10-10 ENCOUNTER — Telehealth: Payer: Self-pay | Admitting: Neurology

## 2016-10-10 NOTE — Telephone Encounter (Signed)
Patient's husband Caryn BeeKevin called wanting to know if we had the formal results of MRI? He also states after IV pt was jittery and then became tired and achy. They asked if these are normal symptoms. Patient's husband also states they have done research and wanted to know if there would be any side effect from patient taking the Vitamin D since she is on Hydrochlorothiazide. Please advise.

## 2016-10-10 NOTE — Telephone Encounter (Signed)
Do we have results of MRI? If not, can you pls call [facility]? It should be more than 48hrs by now. Thanks

## 2016-10-10 NOTE — Telephone Encounter (Signed)
Notified patient/husband  that formal MRI report came back normal, no evidence of MS on spine imaging. The vitamin D is only temporary and once a week, should be okay per Dr. Karel JarvisAquino.

## 2016-10-10 NOTE — Telephone Encounter (Signed)
Patient husband Caryn Beekevin Skolnick wants to ask a couple of questions please call [phone]

## 2016-10-13 DIAGNOSIS — H35031 Hypertensive retinopathy, right eye: Secondary | ICD-10-CM | POA: Diagnosis not present

## 2016-10-13 DIAGNOSIS — H35433 Paving stone degeneration of retina, bilateral: Secondary | ICD-10-CM | POA: Diagnosis not present

## 2016-10-13 DIAGNOSIS — H35423 Microcystoid degeneration of retina, bilateral: Secondary | ICD-10-CM | POA: Diagnosis not present

## 2016-10-13 DIAGNOSIS — H469 Unspecified optic neuritis: Secondary | ICD-10-CM | POA: Diagnosis not present

## 2016-10-16 DIAGNOSIS — R21 Rash and other nonspecific skin eruption: Secondary | ICD-10-CM | POA: Diagnosis not present

## 2016-10-17 ENCOUNTER — Telehealth: Payer: Self-pay

## 2016-10-17 DIAGNOSIS — L509 Urticaria, unspecified: Secondary | ICD-10-CM | POA: Diagnosis not present

## 2016-10-17 DIAGNOSIS — L503 Dermatographic urticaria: Secondary | ICD-10-CM | POA: Diagnosis not present

## 2016-10-17 NOTE — Telephone Encounter (Signed)
Followed up with patient and she states she went to an urgent care yesterday. Urgent care prescribed her Prednisone 20mg  and Hydroxyzine 50mg . They also told patient to hold off on Vitamin E to see if that helps. Patient states she is also going to follow-up with PCP this evening.

## 2016-10-17 NOTE — Telephone Encounter (Signed)
On 10/16/16 at 7:21AM patient's husband called [facility]. Initial Comment: Caller states wife is having side effects after treatment. She had a 3 day infusion of solumedrol a week ago. Yesterday she developed a rash over neck, back, chest.  Confirm and Document: Caller states his wife is being treated for optic neuritis. Was given Solumedrol with Prednisone. It's been a week since she's had this treatment. Also taking high doses of vitamin E. Has tried Benadryl by mouth along with the Benadryl cream.  Care Advise Given Per Guideline: See Physician within 24 hours. PCP.

## 2016-10-18 ENCOUNTER — Ambulatory Visit: Payer: BLUE CROSS/BLUE SHIELD | Admitting: Neurology

## 2016-11-18 DIAGNOSIS — H53432 Sector or arcuate defects, left eye: Secondary | ICD-10-CM | POA: Diagnosis not present

## 2016-12-03 DIAGNOSIS — J029 Acute pharyngitis, unspecified: Secondary | ICD-10-CM | POA: Diagnosis not present

## 2016-12-04 DIAGNOSIS — J069 Acute upper respiratory infection, unspecified: Secondary | ICD-10-CM | POA: Diagnosis not present

## 2016-12-04 DIAGNOSIS — J029 Acute pharyngitis, unspecified: Secondary | ICD-10-CM | POA: Diagnosis not present

## 2016-12-30 DIAGNOSIS — H53432 Sector or arcuate defects, left eye: Secondary | ICD-10-CM | POA: Diagnosis not present

## 2017-01-06 ENCOUNTER — Ambulatory Visit: Payer: BLUE CROSS/BLUE SHIELD | Admitting: Neurology

## 2017-01-06 ENCOUNTER — Encounter: Payer: Self-pay | Admitting: Neurology

## 2017-01-06 ENCOUNTER — Other Ambulatory Visit (INDEPENDENT_AMBULATORY_CARE_PROVIDER_SITE_OTHER): Payer: BLUE CROSS/BLUE SHIELD

## 2017-01-06 ENCOUNTER — Ambulatory Visit (INDEPENDENT_AMBULATORY_CARE_PROVIDER_SITE_OTHER): Payer: BLUE CROSS/BLUE SHIELD | Admitting: Neurology

## 2017-01-06 VITALS — BP 134/72 | HR 85 | Ht 63.0 in | Wt 157.0 lb

## 2017-01-06 DIAGNOSIS — H469 Unspecified optic neuritis: Secondary | ICD-10-CM

## 2017-01-06 DIAGNOSIS — E559 Vitamin D deficiency, unspecified: Secondary | ICD-10-CM | POA: Diagnosis not present

## 2017-01-06 NOTE — Progress Notes (Signed)
NEUROLOGY FOLLOW UP OFFICE NOTE  Hannah Vega [id number]  HISTORY OF PRESENT ILLNESS: I had the pleasure of seeing Hannah Vega in follow-up in the neurology clinic on 01/06/2017.  The patient was last seen 3 months ago for left optic neuritis. Records and images were personally reviewed where available.  I personally reviewed MRI cervical and thoracic spine with and without contrast which did not show any abnormality. Her vitamin D level was low, she was prescribed 50,000 IU of vitamin D but broke out in hives over her chest. This improved after she switched to over the counter high dose vitamin D. She reports that she is feeling much better. She received 3 days of IV Solumedrol. She had a few weeks of "fish eye" where it looked like she was seeing things on IMAX, then vision improved to normal. She has seen her eye doctor and reports vision exam is now normal. She denies any further eye pain, no headaches, dizziness, focal numbness/tingling/weakness, bowel/bladder dysfunction, no falls.   HPI 09/29/2016: This is a pleasant 51 yo RH woman with a history of hypertension, in her usual state of health until the end of September when she had a sore spot between her left brow and eye. The next day she was aching in her whole body with a slight fever and felt horrible for a couple of days. On Saturday, she started noticing "fuzzy vision" on the bottom of her left eye that lasted until the next day. On Monday, blurred vision worsened in the corner of her left eye with a pressure sensation. She saw a retinal specialist with a normal exam per patient. No report of papilledema. She saw her PCP the day after and was diagnosed with a sinus infection and treated with doxycycline as it was felt her left eardrum was tight. Her vision continued to bother her and she went back twice to the eye doctor, repeat Humphrey Matrix test done 09/23/16 showed a left inferior altitudinal defect. Her vision continued to become more  blurred. She had an MRI brain and orbits with and without contrast on 09/26/16 which I personally reviewed, there is abnormal signal and enhancement in the posterior intraorbital and intracanalicular segments of the left optic nerve. MRI brain showed scattered FLAIR abnormalities, a couple were radially-oriented, no enhancement seen. She went to the ER and was discharged home on Prednisone 60mg  daily with follow-up with Neurology. She reports that she has noticed some improvement in vision, but still has the "fuzzy spot" on the left canthal region. She is having a headache today but denies any further eye pain.  She denies any prior similar symptoms in the past, except for an episode of diplopia 12 years ago affecting her left eye. 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 She had an MRI brain at that time reported as normal. 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 She denies any focal numbness/tingling/weakness, she has dizziness when her BP spikes and had some difficulties regulating BP for a few months. She denies any dysarthria/dysphagia, neck/back pain, bowel/bladder dysfunction. No family history of similar symptoms. She denies any recent travels, infections, or immunizations.   PAST MEDICAL HISTORY: Past Medical History:  Diagnosis Date  . Eclampsia in delivered patient 01/30/2013  . Female infertility   . Hand dermatitis   . Hypertension     MEDICATIONS: Current Outpatient Prescriptions on File Prior to Visit  Medication Sig Dispense Refill  .  Calcium 500 MG CHEW Chew 2 tablets by mouth daily.    Marland Kitchen doxycycline (VIBRAMYCIN) 100 MG capsule Take 100 mg by mouth 2 (two) times daily. For 10 days  0  . ibuprofen (ADVIL,MOTRIN) 200 MG tablet Take 400 mg by mouth every 6 (six) hours as needed (for eye pain).    . LOTEMAX 0.5 % GEL Place 1 drop into the left eye 4 (four) times daily. AS DIRECTED  0  . Multiple  Vitamins-Minerals (ONE-A-DAY WOMENS 50+ ADVANTAGE) TABS Take 1 tablet by mouth daily.    . predniSONE (DELTASONE) 20 MG tablet Take 3 tablets (60 mg total) by mouth daily. 21 tablet 0  . triamterene-hydrochlorothiazide (MAXZIDE-25) 37.5-25 MG tablet Take 0.5 tablets by mouth daily.  1  . valsartan (DIOVAN) 80 MG tablet Take 80 mg by mouth daily.  2  . Vitamin D, Ergocalciferol, (DRISDOL) 50000 units CAPS capsule Take 1 capsule (50,000 Units total) by mouth every 7 (seven) days. 8 capsule 0   No current facility-administered medications on file prior to visit.     ALLERGIES: Allergies  Allergen Reactions  . Augmentin [Amoxicillin-Pot Clavulanate] Nausea And Vomiting  . Codeine Nausea And Vomiting  . Darvon [Propoxyphene] Nausea And Vomiting  . Erythromycin Nausea And Vomiting  . Phenergan [Promethazine Hcl] Other (See Comments)    Pain and burning at injection site when given IM    FAMILY HISTORY: Family History  Problem Relation Age of Onset  . Hypertension Mother   . Heart attack Father   . Heart disease Father   . Hypertension Father   . Hypertension Sister   . Stroke Sister   . Hyperlipidemia Brother   . Hypertension Brother     SOCIAL HISTORY: Social History   Social History  . Marital status: Married    Spouse name: N/A  . Number of children: N/A  . Years of education: N/A   Occupational History  . Not on file.   Social History Main Topics  . Smoking status: Never Smoker  . Smokeless tobacco: Never Used  . Alcohol use Yes     Comment: rare  . Drug use: No  . Sexual activity: Yes    Birth control/ protection: None   Other Topics Concern  . Not on file   Social History Narrative  . No narrative on file    REVIEW OF SYSTEMS: Constitutional: No fevers, chills, or sweats, no generalized fatigue, change in appetite Eyes: No visual changes, double vision, eye pain Ear, nose and throat: No hearing loss, ear pain, nasal congestion, sore  throat Cardiovascular: No chest pain, palpitations Respiratory:  No shortness of breath at rest or with exertion, wheezes GastrointestinaI: No nausea, vomiting, diarrhea, abdominal pain, fecal incontinence Genitourinary:  No dysuria, urinary retention or frequency Musculoskeletal:  No neck pain, back pain Integumentary: No rash, pruritus, skin lesions Neurological: as above Psychiatric: No depression, insomnia, anxiety Endocrine: No palpitations, fatigue, diaphoresis, mood swings, change in appetite, change in weight, increased thirst Hematologic/Lymphatic:  No anemia, purpura, petechiae. Allergic/Immunologic: no itchy/runny eyes, nasal congestion, recent allergic reactions, rashes  PHYSICAL EXAM: Vitals:   01/06/17 1448  BP: 134/72  Pulse: 85   General: No acute distress Head:  Normocephalic/atraumatic Neck: supple, no paraspinal tenderness, full range of motion Heart:  Regular rate and rhythm Lungs:  Clear to auscultation bilaterally Back: No paraspinal tenderness Skin/Extremities: No rash, no edema Neurological Exam: alert and oriented to person, place, and time. No aphasia or dysarthria. Fund of knowledge is appropriate.  Recent and remote memory are intact.  Attention and concentration are normal.    Able to name objects and repeat phrases. Cranial nerves: Pupils equal, round, reactive to light. VA 20/25 OD 20/40 OS. No color desaturation. Fundoscopic exam unremarkable, no papilledema. Extraocular movements intact with no nystagmus. Visual fields full. Facial sensation intact. No facial asymmetry. Tongue, uvula, palate midline.  Motor: Bulk and tone normal, muscle strength 5/5 throughout with no pronator drift.  Sensation to light touch intact.  No extinction to double simultaneous stimulation.  Deep tendon reflexes 2+ throughout, toes downgoing.  Finger to nose testing intact.  Gait narrow-based and steady, able to tandem walk adequately.  Romberg negative.  IMPRESSION: This is a  pleasant 51 yo RH woman with a history of hypertension, who presented with left optic neuritis. MRI brain showed scattered white matter changes that are non-specific, no callosal lesions. There were 2 that are radially-oriented, no abnormal enhancement. These changes could be seen due to chronic microvascular disease from hypertension. Her left optic nerve shows abnormal signal and enhancement, consistent with optic neuritis. MRI cervical and thoracic spine normal. She received 3 days of IV Solumedrol, and reports that after a few weeks, vision returned to normal. Her neurological exam is normal. Her vitamin D level was low, she had a rash on high dose vitamin D and is now on over the counter supplement, re-check vitamin D level. No indication to start disease modifying agents for MS, no signs of MS at this time. She is doing well, we will continue to monitor symptoms clinically, she will follow-up in 6 months and knows to call for any changes.   Thank you for allowing me to participate in her care.  Please do not hesitate to call for any questions or concerns.  The duration of this appointment visit was 15 minutes of face-to-face time with the patient.  Greater than 50% of this time was spent in counseling, explanation of diagnosis, planning of further management, and coordination of care.   Patrcia DollyKaren Jaeden [place], M.D.   CC: Lovenia KimMark Hepler, PA-C

## 2017-01-06 NOTE — Patient Instructions (Signed)
You look great! Let's re-check vitamin D level. Follow-up in 6 months, call for any changes.

## 2017-01-09 LAB — VITAMIN D 25 HYDROXY (VIT D DEFICIENCY, FRACTURES): VITD: 44.45 ng/mL (ref 30.00–100.00)

## 2017-01-13 ENCOUNTER — Telehealth: Payer: Self-pay

## 2017-01-13 NOTE — Telephone Encounter (Signed)
Patient notified, verbalized understanding

## 2017-01-13 NOTE — Telephone Encounter (Signed)
-----   Message from Van ClinesKaren M Aquino, MD sent at 01/12/2017  4:11 PM EST ----- Pls let her know vitamin D is now normal. She can actually stop the medication at this point. Thanks

## 2017-01-30 DIAGNOSIS — H35423 Microcystoid degeneration of retina, bilateral: Secondary | ICD-10-CM | POA: Diagnosis not present

## 2017-01-30 DIAGNOSIS — H35031 Hypertensive retinopathy, right eye: Secondary | ICD-10-CM | POA: Diagnosis not present

## 2017-01-30 DIAGNOSIS — H469 Unspecified optic neuritis: Secondary | ICD-10-CM | POA: Diagnosis not present

## 2017-01-30 DIAGNOSIS — H35433 Paving stone degeneration of retina, bilateral: Secondary | ICD-10-CM | POA: Diagnosis not present

## 2017-03-21 DIAGNOSIS — R002 Palpitations: Secondary | ICD-10-CM | POA: Diagnosis not present

## 2017-03-21 DIAGNOSIS — L659 Nonscarring hair loss, unspecified: Secondary | ICD-10-CM | POA: Diagnosis not present

## 2017-03-21 DIAGNOSIS — I10 Essential (primary) hypertension: Secondary | ICD-10-CM | POA: Diagnosis not present

## 2017-04-18 DIAGNOSIS — N3946 Mixed incontinence: Secondary | ICD-10-CM | POA: Diagnosis not present

## 2017-04-18 DIAGNOSIS — R05 Cough: Secondary | ICD-10-CM | POA: Diagnosis not present

## 2017-04-18 DIAGNOSIS — J22 Unspecified acute lower respiratory infection: Secondary | ICD-10-CM | POA: Diagnosis not present

## 2017-04-28 ENCOUNTER — Other Ambulatory Visit: Payer: Self-pay | Admitting: Physician Assistant

## 2017-04-28 DIAGNOSIS — Z1231 Encounter for screening mammogram for malignant neoplasm of breast: Secondary | ICD-10-CM

## 2017-05-17 ENCOUNTER — Ambulatory Visit
Admission: RE | Admit: 2017-05-17 | Discharge: 2017-05-17 | Disposition: A | Discharge status: Home / Self Care | Payer: BLUE CROSS/BLUE SHIELD | Source: Ambulatory Visit | Attending: Physician Assistant | Admitting: Physician Assistant

## 2017-05-17 DIAGNOSIS — Z1231 Encounter for screening mammogram for malignant neoplasm of breast: Secondary | ICD-10-CM

## 2017-06-28 DIAGNOSIS — F800 Phonological disorder: Secondary | ICD-10-CM | POA: Diagnosis not present

## 2017-07-03 DIAGNOSIS — Z6829 Body mass index (BMI) 29.0-29.9, adult: Secondary | ICD-10-CM | POA: Diagnosis not present

## 2017-07-03 DIAGNOSIS — N939 Abnormal uterine and vaginal bleeding, unspecified: Secondary | ICD-10-CM | POA: Diagnosis not present

## 2017-07-03 DIAGNOSIS — Z01419 Encounter for gynecological examination (general) (routine) without abnormal findings: Secondary | ICD-10-CM | POA: Diagnosis not present

## 2017-07-05 DIAGNOSIS — F800 Phonological disorder: Secondary | ICD-10-CM | POA: Diagnosis not present

## 2017-07-07 ENCOUNTER — Encounter: Payer: Self-pay | Admitting: Neurology

## 2017-07-07 ENCOUNTER — Ambulatory Visit (INDEPENDENT_AMBULATORY_CARE_PROVIDER_SITE_OTHER): Payer: BLUE CROSS/BLUE SHIELD | Admitting: Neurology

## 2017-07-07 VITALS — BP 140/70 | HR 94 | Ht 62.5 in | Wt 161.0 lb

## 2017-07-07 DIAGNOSIS — H469 Unspecified optic neuritis: Secondary | ICD-10-CM | POA: Diagnosis not present

## 2017-07-07 NOTE — Patient Instructions (Addendum)
Great seeing you, looking good! Continue all your medications. Follow-up in 1 year, call for any changes

## 2017-07-07 NOTE — Progress Notes (Signed)
NEUROLOGY FOLLOW UP OFFICE NOTE  Hannah Vega [id number]  HISTORY OF PRESENT ILLNESS: I had the pleasure of seeing Hannah Vega in follow-up in the neurology clinic on 07/07/2017.  The patient was last seen 6 months ago for an episode of left optic neuritis in September 2017. She is again accompanied by her husband who helps supplement the history today. Her MRI brain showed scattered FLAIR abnormalities, a couple were radially-oriented, no enhancement seen. MRI cervical and thoracic spine with and without contrast which did not show any abnormality. She received 3 days of IV steroids and had reported that her vision was back to normal on her last visit. Initial vitamin D level was low, she took supplements and last level was 44.45. She reports doing well, no further vision changes, no eye pain. She has occasional sinus headaches. No dizziness, diplopia, neck pain, focal numbness/tingling/weakness, bowel/bladder changes. Sciatica pain occasionally bothers her. No falls.   HPI 09/29/2016: This is a pleasant 51 yo RH woman with a history of hypertension, in her usual state of health until the end of September when she had a sore spot between her left brow and eye. The next day she was aching in her whole body with a slight fever and felt horrible for a couple of days. On Saturday, she started noticing "fuzzy vision" on the bottom of her left eye that lasted until the next day. On Monday, blurred vision worsened in the corner of her left eye with a pressure sensation. She saw a retinal specialist with a normal exam per patient. No report of papilledema. She saw her PCP the day after and was diagnosed with a sinus infection and treated with doxycycline as it was felt her left eardrum was tight. Her vision continued to bother her and she went back twice to the eye doctor, repeat Humphrey Matrix test done 09/23/16 showed a left inferior altitudinal defect. Her vision continued to become more blurred. She had an  MRI brain and orbits with and without contrast on 09/26/16 which I personally reviewed, there is abnormal signal and enhancement in the posterior intraorbital and intracanalicular segments of the left optic nerve. MRI brain showed scattered FLAIR abnormalities, a couple were radially-oriented, no enhancement seen. She went to the ER and was discharged home on Prednisone 60mg  daily with follow-up with Neurology. She reports that she has noticed some improvement in vision, but still has the "fuzzy spot" on the left canthal region. She is having a headache today but denies any further eye pain.  She denies any prior similar symptoms in the past, except for an episode of diplopia 12 years ago affecting her left eye. 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 She had an MRI brain at that time reported as normal. 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 She denies any focal numbness/tingling/weakness, she has dizziness when her BP spikes and had some difficulties regulating BP for a few months. She denies any dysarthria/dysphagia, neck/back pain, bowel/bladder dysfunction. No family history of similar symptoms. She denies any recent travels, infections, or immunizations.   PAST MEDICAL HISTORY: Past Medical History:  Diagnosis Date  . Eclampsia in delivered patient 01/30/2013  . Female infertility   . Hand dermatitis   . Hypertension     MEDICATIONS: Current Outpatient Prescriptions on File Prior to Visit  Medication Sig Dispense Refill  . Calcium 500 MG CHEW Chew 2 tablets by mouth daily.    Marland Kitchen  doxycycline (VIBRAMYCIN) 100 MG capsule Take 100 mg by mouth 2 (two) times daily. For 10 days  0  . ibuprofen (ADVIL,MOTRIN) 200 MG tablet Take 400 mg by mouth every 6 (six) hours as needed (for eye pain).    . LOTEMAX 0.5 % GEL Place 1 drop into the left eye 4 (four) times daily. AS DIRECTED  0  . Multiple Vitamins-Minerals (ONE-A-DAY  WOMENS 50+ ADVANTAGE) TABS Take 1 tablet by mouth daily.    . predniSONE (DELTASONE) 20 MG tablet Take 3 tablets (60 mg total) by mouth daily. (Patient not taking: Reported on 01/06/2017) 21 tablet 0  . triamterene-hydrochlorothiazide (MAXZIDE-25) 37.5-25 MG tablet Take 0.5 tablets by mouth daily.  1  . valsartan (DIOVAN) 80 MG tablet Take 80 mg by mouth daily.  2  . Vitamin D, Ergocalciferol, (DRISDOL) 50000 units CAPS capsule Take 1 capsule (50,000 Units total) by mouth every 7 (seven) days. (Patient not taking: Reported on 01/06/2017) 8 capsule 0   No current facility-administered medications on file prior to visit.     ALLERGIES: Allergies  Allergen Reactions  . Augmentin [Amoxicillin-Pot Clavulanate] Nausea And Vomiting  . Codeine Nausea And Vomiting  . Darvon [Propoxyphene] Nausea And Vomiting  . Erythromycin Nausea And Vomiting  . Phenergan [Promethazine Hcl] Other (See Comments)    Pain and burning at injection site when given IM    FAMILY HISTORY: Family History  Problem Relation Age of Onset  . Hypertension Mother   . Heart attack Father   . Heart disease Father   . Hypertension Father   . Hypertension Sister   . Stroke Sister   . Hyperlipidemia Brother   . Hypertension Brother     SOCIAL HISTORY: Social History   Social History  . Marital status: Married    Spouse name: N/A  . Number of children: N/A  . Years of education: N/A   Occupational History  . Not on file.   Social History Main Topics  . Smoking status: Never Smoker  . Smokeless tobacco: Never Used  . Alcohol use Yes     Comment: rare  . Drug use: No  . Sexual activity: Yes    Birth control/ protection: None   Other Topics Concern  . Not on file   Social History Narrative  . No narrative on file    REVIEW OF SYSTEMS: Constitutional: No fevers, chills, or sweats, no generalized fatigue, change in appetite Eyes: No visual changes, double vision, eye pain Ear, nose and throat: No hearing  loss, ear pain, nasal congestion, sore throat Cardiovascular: No chest pain, palpitations Respiratory:  No shortness of breath at rest or with exertion, wheezes GastrointestinaI: No nausea, vomiting, diarrhea, abdominal pain, fecal incontinence Genitourinary:  No dysuria, urinary retention or frequency Musculoskeletal:  No neck pain, + occl back pain Integumentary: No rash, pruritus, skin lesions Neurological: as above Psychiatric: No depression, insomnia, anxiety Endocrine: No palpitations, fatigue, diaphoresis, mood swings, change in appetite, change in weight, increased thirst Hematologic/Lymphatic:  No anemia, purpura, petechiae. Allergic/Immunologic: no itchy/runny eyes, nasal congestion, recent allergic reactions, rashes  PHYSICAL EXAM: Vitals:   07/07/17 1459  BP: 140/70  Pulse: 94   General: No acute distress Head:  Normocephalic/atraumatic Neck: supple, no paraspinal tenderness, full range of motion Heart:  Regular rate and rhythm Lungs:  Clear to auscultation bilaterally Back: No paraspinal tenderness Skin/Extremities: No rash, no edema Neurological Exam: alert and oriented to person, place, and time. No aphasia or dysarthria. Fund of knowledge is appropriate.  Recent and remote memory are intact. 3/3 delayed recall. Attention and concentration are normal.    Able to name objects and repeat phrases. Cranial nerves: Pupils equal, round, reactive to light. Fundoscopic exam unremarkable, no papilledema. Extraocular movements intact with no nystagmus. Visual fields full. Facial sensation intact. No facial asymmetry. Tongue, uvula, palate midline.  Motor: Bulk and tone normal, muscle strength 5/5 throughout with no pronator drift.  Sensation to light touch intact.  No extinction to double simultaneous stimulation.  Deep tendon reflexes +1 throughout, toes downgoing.  Finger to nose testing intact.  Gait narrow-based and steady, able to tandem walk adequately.  Romberg  negative.  IMPRESSION: This is a pleasant 51 yo RH woman with a history of hypertension, who presented with left optic neuritis in September 2017. MRI brain showed scattered white matter changes that are non-specific, no callosal lesions. There were 2 that are radially-oriented, no abnormal enhancement. These changes could be seen due to chronic microvascular disease from hypertension. Her left optic nerve shows abnormal signal and enhancement, consistent with optic neuritis. MRI cervical and thoracic spine normal. She received 3 days of IV Solumedrol, and reports that after a few weeks, vision returned to normal. Her neurological exam is normal. She continues to do well. No indication to start disease modifying agents for MS, no signs of MS at this time. She is doing well, we will continue to monitor symptoms clinically, she will follow-up in 1 year and knows to call for any changes.   Thank you for allowing me to participate in her care.  Please do not hesitate to call for any questions or concerns.  The duration of this appointment visit was 15 minutes of face-to-face time with the patient.  Greater than 50% of this time was spent in counseling, explanation of diagnosis, planning of further management, and coordination of care.   Patrcia Dolly, M.D.   CC: Lovenia Kim, PA-C

## 2017-07-28 DIAGNOSIS — I10 Essential (primary) hypertension: Secondary | ICD-10-CM | POA: Diagnosis not present

## 2017-08-23 DIAGNOSIS — F800 Phonological disorder: Secondary | ICD-10-CM | POA: Diagnosis not present

## 2017-08-30 DIAGNOSIS — F800 Phonological disorder: Secondary | ICD-10-CM | POA: Diagnosis not present

## 2017-09-06 DIAGNOSIS — F800 Phonological disorder: Secondary | ICD-10-CM | POA: Diagnosis not present

## 2017-09-23 IMAGING — MG 2D DIGITAL SCREENING BILATERAL MAMMOGRAM WITH CAD AND ADJUNCT TO
9 of 12 series · 9 of 28 positions shown · non-contrast
Comparison: Previous exam(s).

CLINICAL DATA: Screening.

EXAM:
2D DIGITAL SCREENING BILATERAL MAMMOGRAM WITH CAD AND ADJUNCT TOMO

[R MLO]
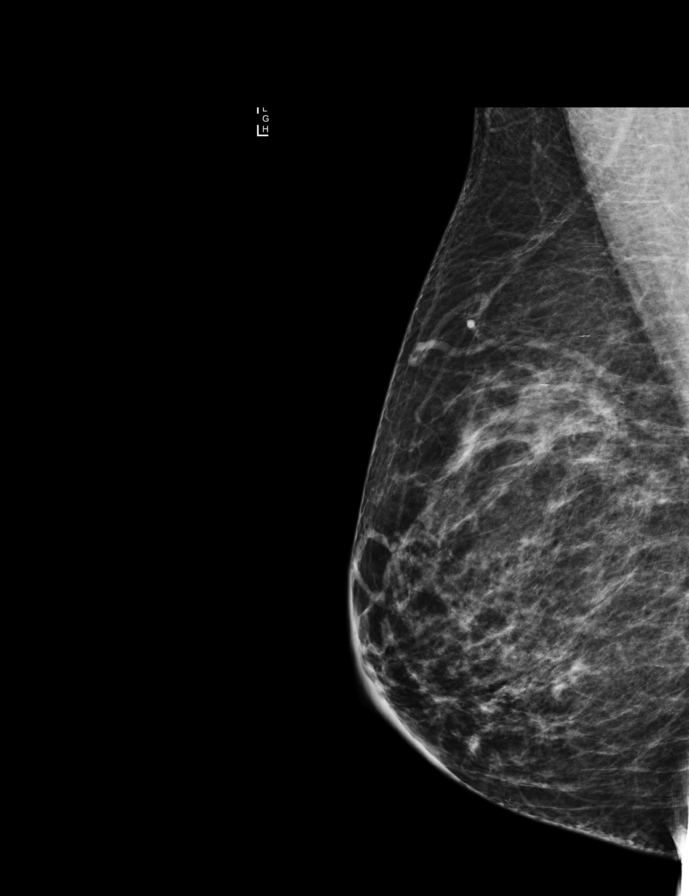

[R CC]
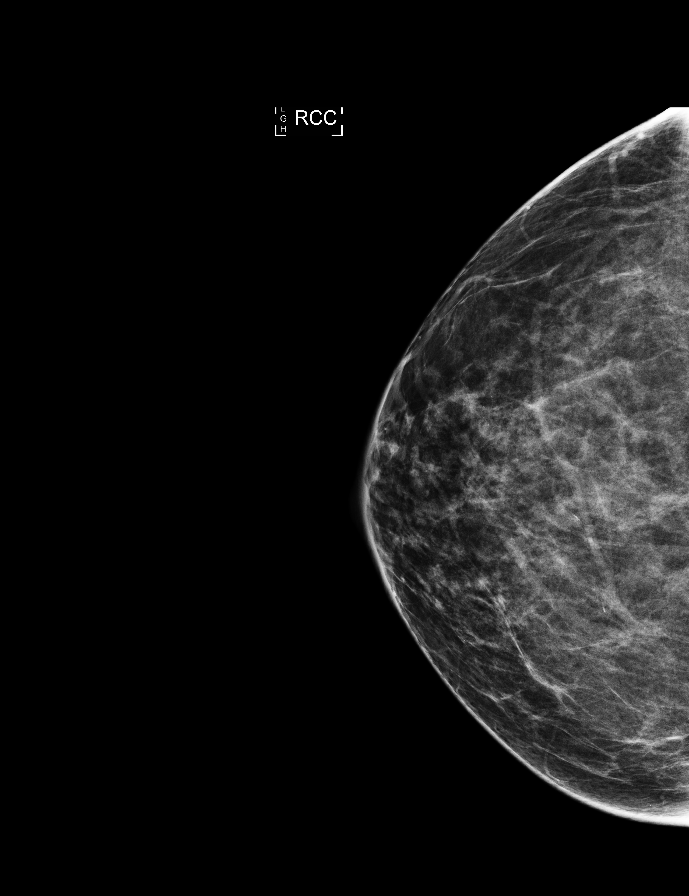

[R CC synth-2D]
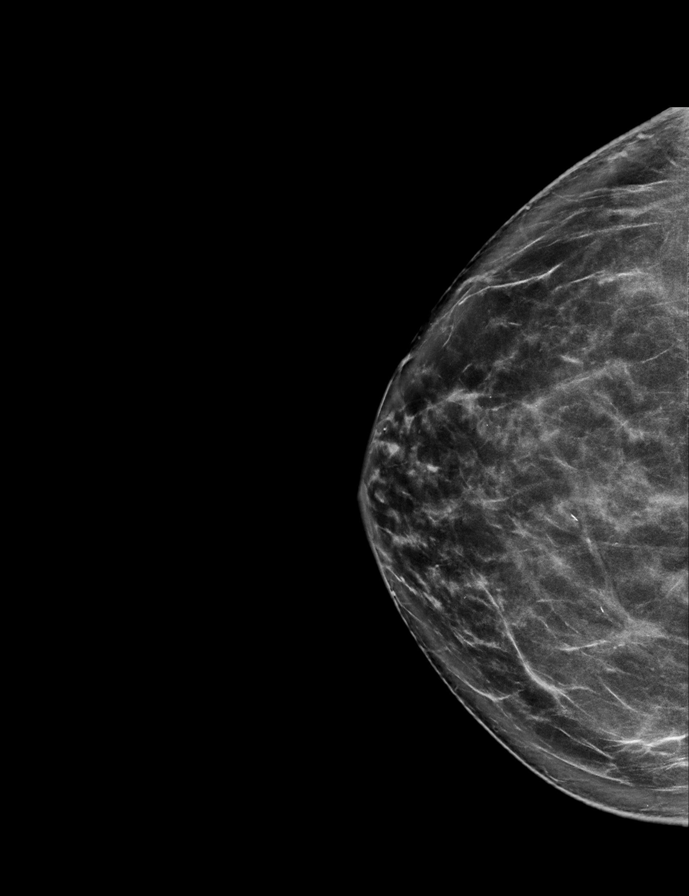

[R MLO synth-2D]
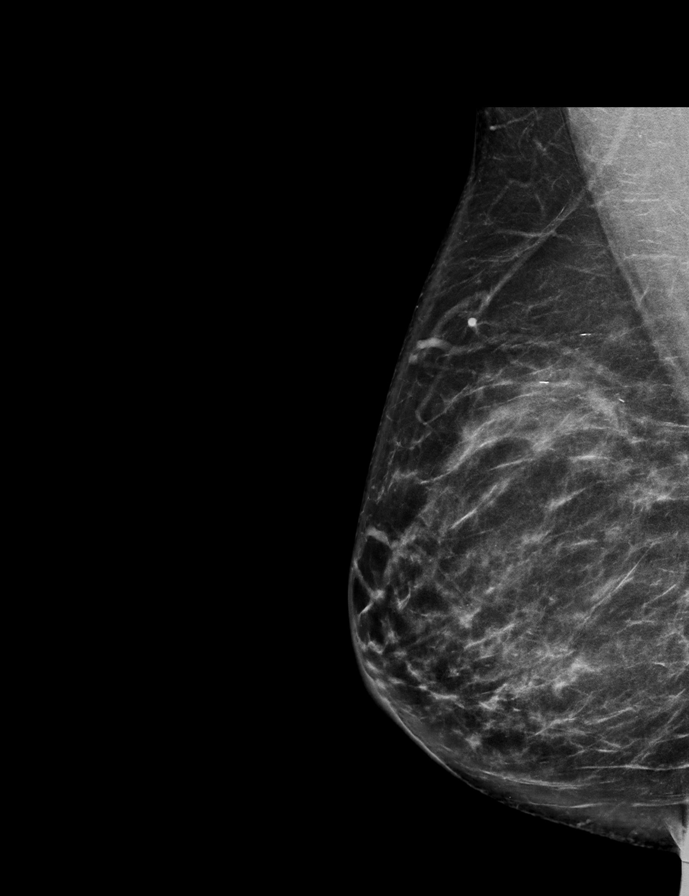

[L MLO synth-2D]
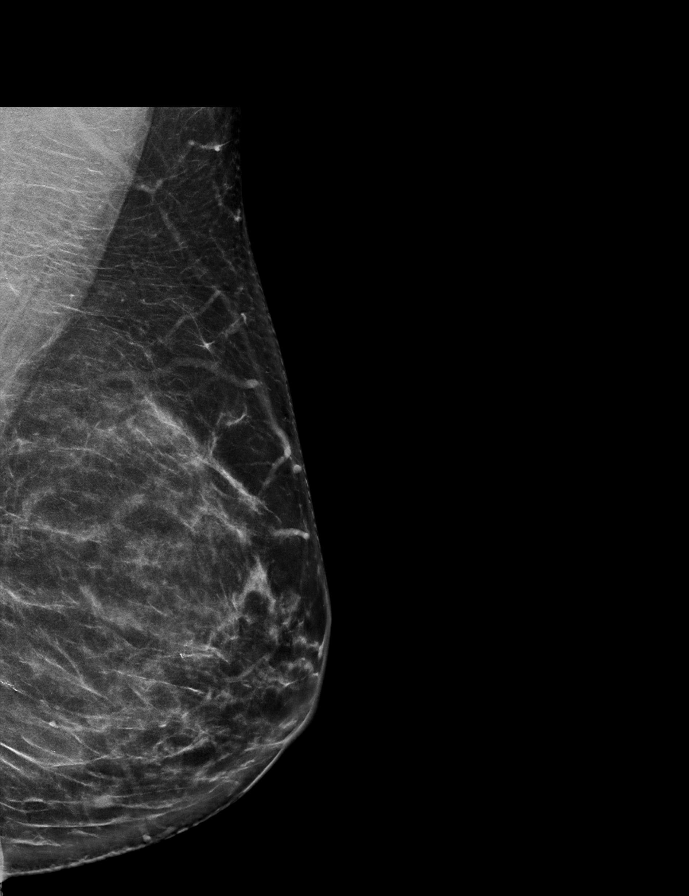

[L CC synth-2D]
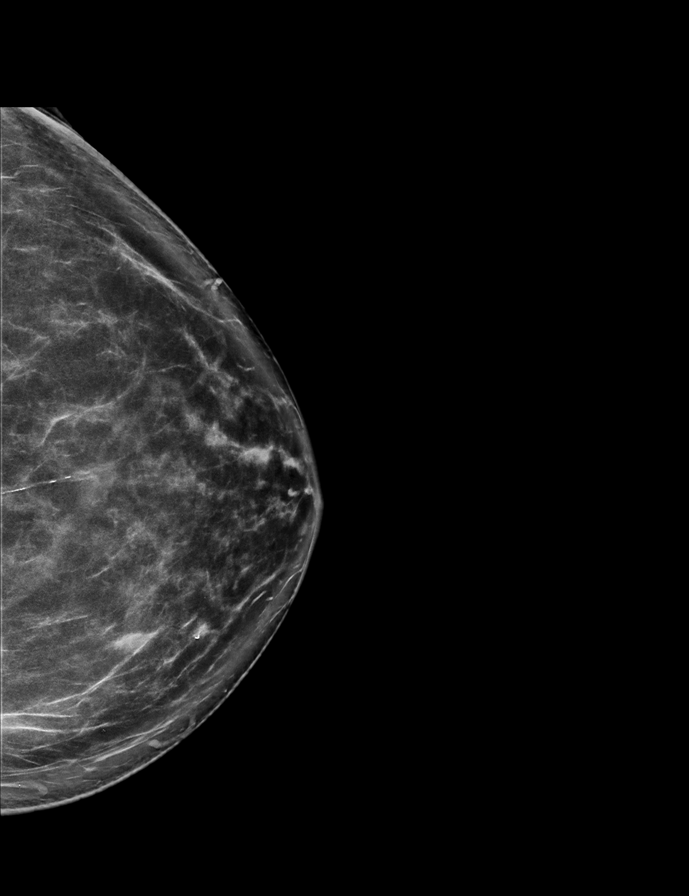

[L MLO]
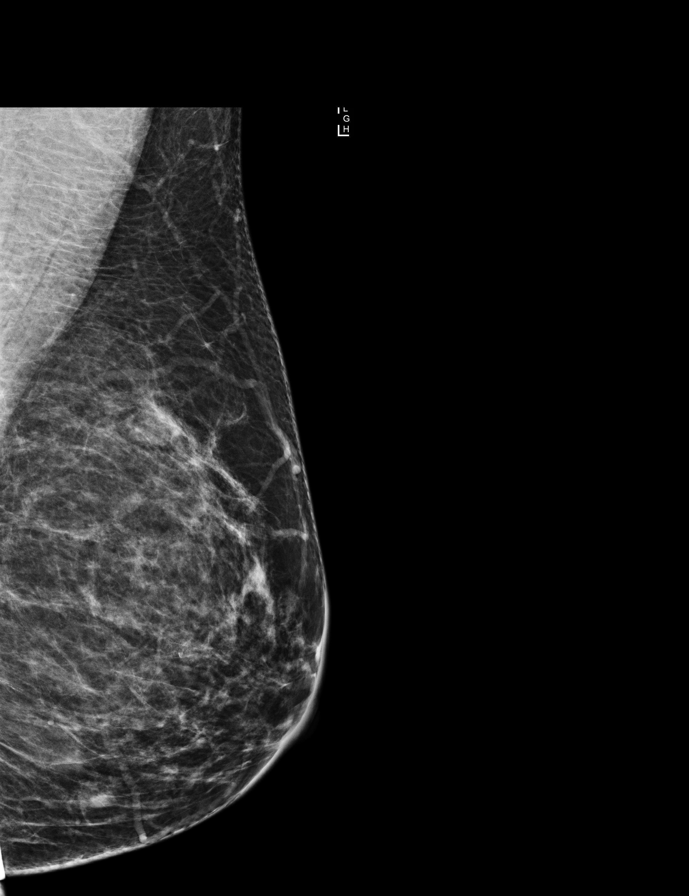

[L CC]
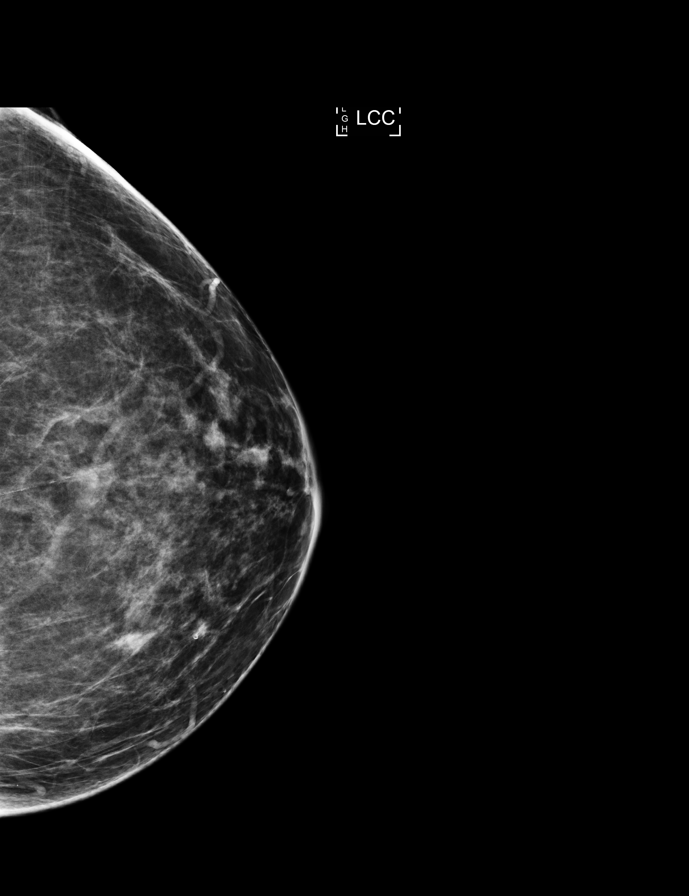

[L CC tomo · tomo slice 39/78.0]
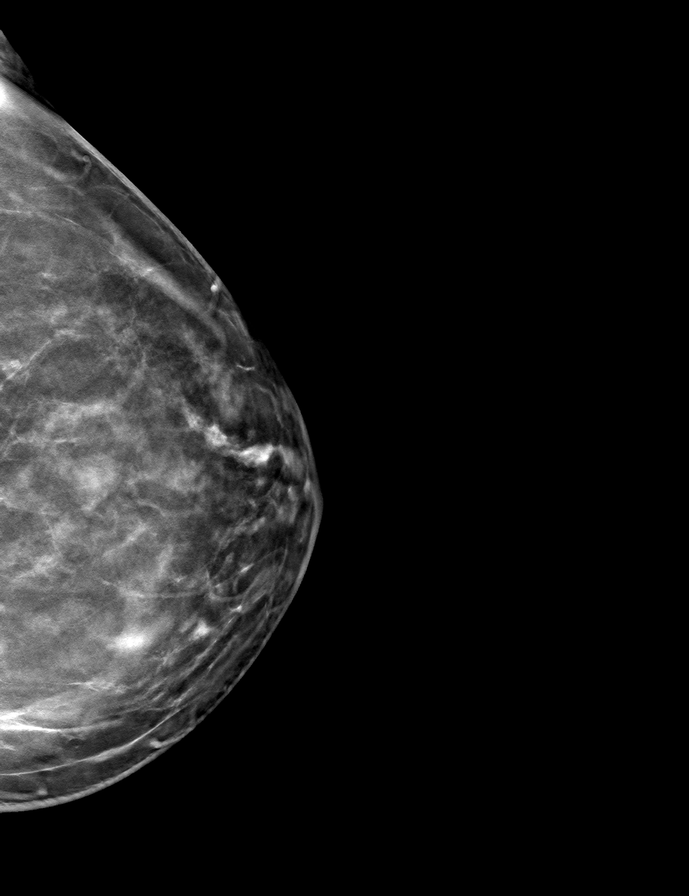

[9 of 28 positions shown; findings below may reference images not displayed]

ACR Breast Density Category b: There are scattered areas of
fibroglandular density.
FINDINGS: There are no findings suspicious for malignancy. Images were
processed with CAD.
IMPRESSION: No mammographic evidence of malignancy. A result letter of this
screening mammogram will be mailed directly to the patient.

RECOMMENDATION:
Screening mammogram in one year. (Code:97-6-RS4)

BI-RADS CATEGORY  1: Negative.

## 2017-10-09 DIAGNOSIS — F411 Generalized anxiety disorder: Secondary | ICD-10-CM | POA: Diagnosis not present

## 2017-10-09 DIAGNOSIS — Z1211 Encounter for screening for malignant neoplasm of colon: Secondary | ICD-10-CM | POA: Diagnosis not present

## 2017-10-09 DIAGNOSIS — I10 Essential (primary) hypertension: Secondary | ICD-10-CM | POA: Diagnosis not present

## 2017-10-12 DIAGNOSIS — Z23 Encounter for immunization: Secondary | ICD-10-CM | POA: Diagnosis not present

## 2017-11-15 DIAGNOSIS — R197 Diarrhea, unspecified: Secondary | ICD-10-CM | POA: Diagnosis not present

## 2017-11-15 DIAGNOSIS — G4700 Insomnia, unspecified: Secondary | ICD-10-CM | POA: Diagnosis not present

## 2018-03-08 DIAGNOSIS — Z1211 Encounter for screening for malignant neoplasm of colon: Secondary | ICD-10-CM | POA: Diagnosis not present

## 2018-03-08 DIAGNOSIS — R350 Frequency of micturition: Secondary | ICD-10-CM | POA: Diagnosis not present

## 2018-03-08 DIAGNOSIS — I10 Essential (primary) hypertension: Secondary | ICD-10-CM | POA: Diagnosis not present

## 2018-03-08 DIAGNOSIS — R5383 Other fatigue: Secondary | ICD-10-CM | POA: Diagnosis not present

## 2018-04-09 DIAGNOSIS — I10 Essential (primary) hypertension: Secondary | ICD-10-CM | POA: Diagnosis not present

## 2018-04-09 DIAGNOSIS — F411 Generalized anxiety disorder: Secondary | ICD-10-CM | POA: Diagnosis not present

## 2018-07-09 ENCOUNTER — Ambulatory Visit: Payer: BLUE CROSS/BLUE SHIELD | Admitting: Neurology

## 2018-08-02 DIAGNOSIS — R609 Edema, unspecified: Secondary | ICD-10-CM | POA: Diagnosis not present

## 2018-08-02 DIAGNOSIS — I10 Essential (primary) hypertension: Secondary | ICD-10-CM | POA: Diagnosis not present

## 2018-08-02 DIAGNOSIS — K130 Diseases of lips: Secondary | ICD-10-CM | POA: Diagnosis not present

## 2018-08-02 DIAGNOSIS — L989 Disorder of the skin and subcutaneous tissue, unspecified: Secondary | ICD-10-CM | POA: Diagnosis not present

## 2018-08-24 DIAGNOSIS — S60861A Insect bite (nonvenomous) of right wrist, initial encounter: Secondary | ICD-10-CM | POA: Diagnosis not present

## 2018-09-18 DIAGNOSIS — H6242 Otitis externa in other diseases classified elsewhere, left ear: Secondary | ICD-10-CM | POA: Diagnosis not present

## 2018-09-18 DIAGNOSIS — R05 Cough: Secondary | ICD-10-CM | POA: Diagnosis not present

## 2018-09-18 DIAGNOSIS — H60502 Unspecified acute noninfective otitis externa, left ear: Secondary | ICD-10-CM | POA: Diagnosis not present

## 2018-09-25 DIAGNOSIS — M25572 Pain in left ankle and joints of left foot: Secondary | ICD-10-CM | POA: Diagnosis not present

## 2018-09-25 DIAGNOSIS — R21 Rash and other nonspecific skin eruption: Secondary | ICD-10-CM | POA: Diagnosis not present

## 2018-09-25 DIAGNOSIS — J0100 Acute maxillary sinusitis, unspecified: Secondary | ICD-10-CM | POA: Diagnosis not present

## 2018-09-25 DIAGNOSIS — J069 Acute upper respiratory infection, unspecified: Secondary | ICD-10-CM | POA: Diagnosis not present

## 2018-09-25 DIAGNOSIS — M25571 Pain in right ankle and joints of right foot: Secondary | ICD-10-CM | POA: Diagnosis not present

## 2018-10-03 ENCOUNTER — Other Ambulatory Visit: Payer: Self-pay | Admitting: Physician Assistant

## 2018-10-03 DIAGNOSIS — Z1231 Encounter for screening mammogram for malignant neoplasm of breast: Secondary | ICD-10-CM

## 2018-11-01 DIAGNOSIS — I10 Essential (primary) hypertension: Secondary | ICD-10-CM | POA: Diagnosis not present

## 2018-11-09 ENCOUNTER — Ambulatory Visit
Admission: RE | Admit: 2018-11-09 | Discharge: 2018-11-09 | Disposition: A | Discharge status: Home / Self Care | Payer: BLUE CROSS/BLUE SHIELD | Source: Ambulatory Visit | Attending: Physician Assistant | Admitting: Physician Assistant

## 2018-11-09 DIAGNOSIS — Z1231 Encounter for screening mammogram for malignant neoplasm of breast: Secondary | ICD-10-CM | POA: Diagnosis not present

## 2018-11-29 DIAGNOSIS — R509 Fever, unspecified: Secondary | ICD-10-CM | POA: Diagnosis not present

## 2018-11-29 DIAGNOSIS — R52 Pain, unspecified: Secondary | ICD-10-CM | POA: Diagnosis not present

## 2019-02-27 DIAGNOSIS — L812 Freckles: Secondary | ICD-10-CM | POA: Diagnosis not present

## 2019-05-02 DIAGNOSIS — F411 Generalized anxiety disorder: Secondary | ICD-10-CM | POA: Diagnosis not present

## 2019-05-02 DIAGNOSIS — I10 Essential (primary) hypertension: Secondary | ICD-10-CM | POA: Diagnosis not present

## 2020-12-17 ENCOUNTER — Other Ambulatory Visit: Payer: Self-pay | Admitting: Family Medicine

## 2020-12-17 DIAGNOSIS — Z1231 Encounter for screening mammogram for malignant neoplasm of breast: Secondary | ICD-10-CM

## 2021-01-08 ENCOUNTER — Ambulatory Visit
Admission: RE | Admit: 2021-01-08 | Discharge: 2021-01-08 | Disposition: A | Discharge status: Home / Self Care | Payer: BLUE CROSS/BLUE SHIELD | Source: Ambulatory Visit | Attending: Family Medicine | Admitting: Family Medicine

## 2021-01-08 ENCOUNTER — Other Ambulatory Visit: Payer: Self-pay

## 2021-01-08 DIAGNOSIS — Z1231 Encounter for screening mammogram for malignant neoplasm of breast: Secondary | ICD-10-CM

## 2023-01-16 ENCOUNTER — Other Ambulatory Visit: Payer: Self-pay | Admitting: Family Medicine

## 2023-01-16 DIAGNOSIS — Z1231 Encounter for screening mammogram for malignant neoplasm of breast: Secondary | ICD-10-CM

## 2023-04-11 ENCOUNTER — Ambulatory Visit
Admission: RE | Admit: 2023-04-11 | Discharge: 2023-04-11 | Disposition: A | Discharge status: Home / Self Care | Payer: 59 | Source: Ambulatory Visit | Attending: Family Medicine | Admitting: Family Medicine

## 2023-04-11 DIAGNOSIS — Z1231 Encounter for screening mammogram for malignant neoplasm of breast: Secondary | ICD-10-CM

## 2024-01-14 ENCOUNTER — Other Ambulatory Visit: Payer: Self-pay

## 2024-01-14 ENCOUNTER — Ambulatory Visit
Admission: RE | Admit: 2024-01-14 | Discharge: 2024-01-14 | Disposition: A | Discharge status: Home / Self Care | Payer: 59 | Source: Ambulatory Visit

## 2024-01-14 VITALS — BP 119/71 | HR 79 | Temp 97.8°F | Resp 16

## 2024-01-14 DIAGNOSIS — R059 Cough, unspecified: Secondary | ICD-10-CM | POA: Diagnosis not present

## 2024-01-14 DIAGNOSIS — J0100 Acute maxillary sinusitis, unspecified: Secondary | ICD-10-CM

## 2024-01-14 DIAGNOSIS — R0981 Nasal congestion: Secondary | ICD-10-CM

## 2024-01-14 MED ORDER — PREDNISONE 20 MG PO TABS: ORAL_TABLET | ORAL | 0 refills | Status: AC

## 2024-01-14 MED ORDER — DOXYCYCLINE HYCLATE 100 MG PO CAPS: 100.0000 mg | ORAL_CAPSULE | Freq: Two times a day (BID) | ORAL | 0 refills | Status: AC

## 2024-01-14 NOTE — Discharge Instructions (Addendum)
Advised patient take medication as directed with food to completion.  Advised patient to take prednisone with first dose of doxycycline for the next 5 of 7 days. Encouraged to increase daily water intake to 64 ounces per day while taking these medications.  Advised if symptoms worsen and/or unresolved please follow-up PCP or here for further evaluation.

## 2024-01-14 NOTE — ED Triage Notes (Signed)
Pt presents to [facility] with co of cough since the first of the year. Pt reports she was ill with the flu at [facility] and got better but the cough has continued. Pt has been taking Claritin, coricidin, Flonase, saline.

## 2024-01-14 NOTE — ED Provider Notes (Signed)
[name]    CSN: [id number] Arrival date & time: 01/14/24  1014      History   Chief Complaint Chief Complaint  Patient presents with   Cough    HPI Hannah Vega is a 58 y.o. female.   HPI 58 year old female presents with cough for 3 to 4 weeks.  PMH significant for HTN.  Past Medical History:  Diagnosis Date   Eclampsia in delivered patient 01/30/2013   Female infertility    Hand dermatitis    Hypertension     Patient Active Problem List   Diagnosis Date Noted   Optic neuritis 09/30/2016   Pericardial effusion 02/03/2013   Eclampsia in delivered patient 01/30/2013   PRES (posterior reversible encephalopathy syndrome) 01/30/2013   Other convulsions 01/30/2013    Past Surgical History:  Procedure Laterality Date   CESAREAN SECTION N/A 01/29/2013   Procedure: CESAREAN SECTION  twins 35 weeks;  Surgeon: Lenoard Aden, MD;  Location: [facility];  Service: Obstetrics;  Laterality: N/A;  Primary C/S   EDD: 03/05/13   TONSILLECTOMY     TYMPANOSTOMY TUBE PLACEMENT      OB History     Gravida  1   Para  1   Term      Preterm  1   AB      Living  2      SAB      IAB      Ectopic      Multiple  1   Live Births  2            Home Medications    Prior to Admission medications   Medication Sig Start Date End Date Taking? Authorizing Provider  doxycycline (VIBRAMYCIN) 100 MG capsule Take 1 capsule (100 mg total) by mouth 2 (two) times daily for 7 days. 01/14/24 01/21/24 Yes Trevor Iha, FNP  predniSONE (DELTASONE) 20 MG tablet Take 3 tabs PO daily x 5 days. 01/14/24  Yes Trevor Iha, FNP  ibuprofen (ADVIL,MOTRIN) 200 MG tablet Take 400 mg by mouth every 6 (six) hours as needed (for eye pain).    [provider]  Multiple Vitamins-Minerals (ONE-A-DAY WOMENS 50+ ADVANTAGE) TABS Take 1 tablet by mouth daily.    [provider]  olmesartan (BENICAR) 20 MG tablet Take 20 mg by mouth daily.    [provider]   triamterene-hydrochlorothiazide (MAXZIDE-25) 37.5-25 MG tablet Take 0.5 tablets by mouth daily. 08/30/16   [provider]  valsartan (DIOVAN) 80 MG tablet Take 80 mg by mouth daily. 09/06/16   [provider]    Family History Family History  Problem Relation Age of Onset   Hypertension Mother    Heart attack Father    Heart disease Father    Hypertension Father    Hypertension Sister    Stroke Sister    Hyperlipidemia Brother    Hypertension Brother    Breast cancer Neg Hx     Social History Social History   Tobacco Use   Smoking status: Never   Smokeless tobacco: Never  Substance Use Topics   Alcohol use: Yes    Comment: rare   Drug use: No     Allergies   Augmentin [amoxicillin-pot clavulanate], Codeine, Darvon [propoxyphene], Erythromycin, and Phenergan [promethazine hcl]   Review of Systems Review of Systems  Respiratory:  Positive for cough.      Physical Exam Triage Vital Signs ED Triage Vitals [01/14/24 1056]  Encounter Vitals Group  BP 119/71     Systolic BP Percentile      Diastolic BP Percentile      Pulse Rate 79     Resp 16     Temp 97.8 F (36.6 C)     Temp src      SpO2 98 %     Weight      Height      Head Circumference      Peak Flow      Pain Score      Pain Loc      Pain Education      Exclude from Growth Chart    No data found.  Updated Vital Signs BP 119/71   Pulse 79   Temp 97.8 F (36.6 C)   Resp 16   LMP 04/02/2017   SpO2 98%   Physical Exam Vitals and nursing note reviewed.  Constitutional:      Appearance: Normal appearance. She is normal weight.  HENT:     Head: Normocephalic and atraumatic.     Right Ear: Tympanic membrane, ear canal and external ear normal.     Left Ear: Tympanic membrane, ear canal and external ear normal.     Nose:     Right Sinus: Maxillary sinus tenderness present.     Left Sinus: Maxillary sinus tenderness present.     Comments: Turbinates are  erythematous/edematous    Mouth/Throat:     Mouth: Mucous membranes are moist.     Pharynx: Oropharynx is clear.  Eyes:     Extraocular Movements: Extraocular movements intact.     Conjunctiva/sclera: Conjunctivae normal.     Pupils: Pupils are equal, round, and reactive to light.  Cardiovascular:     Rate and Rhythm: Normal rate and regular rhythm.     Pulses: Normal pulses.     Heart sounds: Normal heart sounds.  Pulmonary:     Effort: Pulmonary effort is normal.     Breath sounds: Normal breath sounds. No wheezing, rhonchi or rales.  Musculoskeletal:        General: Normal range of motion.     Cervical back: Normal range of motion and neck supple.  Skin:    General: Skin is warm and dry.  Neurological:     General: No focal deficit present.     Mental Status: She is alert and oriented to person, place, and time. Mental status is at baseline.      UC Treatments / Results  Labs (all labs ordered are listed, but only abnormal results are displayed) Labs Reviewed - No data to display  EKG   Radiology No results found.  Procedures Procedures (including critical care time)  Medications Ordered in UC Medications - No data to display  Initial Impression / Assessment and Plan / UC Course  I have reviewed the triage vital signs and the nursing notes.  Pertinent labs & imaging results that were available during my care of the patient were reviewed by me and considered in my medical decision making (see chart for details).     MDM: 1.  Acute maxillary sinusitis, recurrence not specified-Rx'd doxycycline 100 mg capsule: Take 1 capsule twice daily x 7 days; 2.  Cough, unspecified type-Rx'd prednisone 20 mg tablet: Take 3 tablets p.o. daily x 5 days; 3.  Nasal sinus congestion-Rx'd prednisone 20 mg tablet: Take 3 tablets p.o. daily x 5 days. Advised patient take medication as directed with food to completion.  Advised patient to take prednisone with first  dose of Augmentin for  the next 5 of 7 days. Encouraged to increase daily water intake to 64 ounces per day while taking these medications.  Advised if symptoms worsen and/or unresolved please follow-up PCP or here for further evaluation. Final Clinical Impressions(s) / UC Diagnoses   Final diagnoses:  Cough, unspecified type  Acute maxillary sinusitis, recurrence not specified  Nasal sinus congestion     Discharge Instructions      Advised patient take medication as directed with food to completion.  Advised patient to take prednisone with first dose of doxycycline for the next 5 of 7 days. Encouraged to increase daily water intake to 64 ounces per day while taking these medications.  Advised if symptoms worsen and/or unresolved please follow-up PCP or here for further evaluation.     ED Prescriptions     Medication Sig Dispense Auth. Provider   doxycycline (VIBRAMYCIN) 100 MG capsule Take 1 capsule (100 mg total) by mouth 2 (two) times daily for 7 days. 14 capsule Trevor Iha, FNP   predniSONE (DELTASONE) 20 MG tablet Take 3 tabs PO daily x 5 days. 15 tablet Trevor Iha, FNP      PDMP not reviewed this encounter.   Trevor Iha, FNP 01/14/24 1217

## 2025-01-08 ENCOUNTER — Other Ambulatory Visit: Payer: Self-pay

## 2025-01-08 DIAGNOSIS — Z1231 Encounter for screening mammogram for malignant neoplasm of breast: Secondary | ICD-10-CM

## 2025-01-17 ENCOUNTER — Ambulatory Visit

## 2025-02-07 ENCOUNTER — Ambulatory Visit
